# Patient Record
Sex: Male | Born: 1975 | Race: White | Hispanic: No | Marital: Married | Smoking: Current every day smoker
Health system: Southern US, Community
[De-identification: ages and names within clinical notes are randomized; demographics above are authoritative.]

## PROBLEM LIST (undated history)

## (undated) DIAGNOSIS — F988 Other specified behavioral and emotional disorders with onset usually occurring in childhood and adolescence: Secondary | ICD-10-CM

## (undated) DIAGNOSIS — Z8619 Personal history of other infectious and parasitic diseases: Secondary | ICD-10-CM

## (undated) DIAGNOSIS — F32A Depression, unspecified: Secondary | ICD-10-CM

## (undated) DIAGNOSIS — K219 Gastro-esophageal reflux disease without esophagitis: Secondary | ICD-10-CM

## (undated) DIAGNOSIS — F329 Major depressive disorder, single episode, unspecified: Secondary | ICD-10-CM

## (undated) HISTORY — PX: WISDOM TOOTH EXTRACTION: SHX21

## (undated) HISTORY — DX: Depression, unspecified: F32.A

## (undated) HISTORY — DX: Personal history of other infectious and parasitic diseases: Z86.19

## (undated) HISTORY — DX: Major depressive disorder, single episode, unspecified: F32.9

## (undated) HISTORY — PX: ESOPHAGOGASTRODUODENOSCOPY: SHX1529

## (undated) HISTORY — DX: Gastro-esophageal reflux disease without esophagitis: K21.9

## (undated) HISTORY — DX: Other specified behavioral and emotional disorders with onset usually occurring in childhood and adolescence: F98.8

---

## 2005-11-11 ENCOUNTER — Emergency Department: Payer: Self-pay | Admitting: Emergency Medicine

## 2010-02-16 ENCOUNTER — Emergency Department: Payer: Self-pay | Admitting: Emergency Medicine

## 2013-05-24 ENCOUNTER — Encounter (INDEPENDENT_AMBULATORY_CARE_PROVIDER_SITE_OTHER): Payer: Self-pay

## 2013-05-24 ENCOUNTER — Encounter: Payer: Self-pay | Admitting: Adult Health

## 2013-05-24 ENCOUNTER — Ambulatory Visit (INDEPENDENT_AMBULATORY_CARE_PROVIDER_SITE_OTHER): Payer: BC Managed Care – PPO | Admitting: Adult Health

## 2013-05-24 VITALS — BP 114/76 | HR 58 | Temp 97.8°F | Resp 14 | Ht 67.2 in | Wt 182.0 lb

## 2013-05-24 DIAGNOSIS — F17200 Nicotine dependence, unspecified, uncomplicated: Secondary | ICD-10-CM

## 2013-05-24 DIAGNOSIS — Z7189 Other specified counseling: Secondary | ICD-10-CM

## 2013-05-24 DIAGNOSIS — F988 Other specified behavioral and emotional disorders with onset usually occurring in childhood and adolescence: Secondary | ICD-10-CM

## 2013-05-24 DIAGNOSIS — Z23 Encounter for immunization: Secondary | ICD-10-CM

## 2013-05-24 DIAGNOSIS — Z716 Tobacco abuse counseling: Secondary | ICD-10-CM | POA: Insufficient documentation

## 2013-05-24 DIAGNOSIS — Z79899 Other long term (current) drug therapy: Secondary | ICD-10-CM

## 2013-05-24 DIAGNOSIS — K219 Gastro-esophageal reflux disease without esophagitis: Secondary | ICD-10-CM

## 2013-05-24 LAB — CBC WITH DIFFERENTIAL/PLATELET
BASOS PCT: 0.5 % (ref 0.0–3.0)
Basophils Absolute: 0.1 10*3/uL (ref 0.0–0.1)
EOS ABS: 0.4 10*3/uL (ref 0.0–0.7)
Eosinophils Relative: 2.7 % (ref 0.0–5.0)
HCT: 47.8 % (ref 39.0–52.0)
Hemoglobin: 16.1 g/dL (ref 13.0–17.0)
Lymphocytes Relative: 20.8 % (ref 12.0–46.0)
Lymphs Abs: 2.8 10*3/uL (ref 0.7–4.0)
MCHC: 33.7 g/dL (ref 30.0–36.0)
MCV: 88.3 fl (ref 78.0–100.0)
MONO ABS: 0.4 10*3/uL (ref 0.1–1.0)
Monocytes Relative: 2.9 % — ABNORMAL LOW (ref 3.0–12.0)
NEUTROS PCT: 73.1 % (ref 43.0–77.0)
Neutro Abs: 10 10*3/uL — ABNORMAL HIGH (ref 1.4–7.7)
Platelets: 345 10*3/uL (ref 150.0–400.0)
RBC: 5.41 Mil/uL (ref 4.22–5.81)
RDW: 13.6 % (ref 11.5–14.6)
WBC: 13.6 10*3/uL — ABNORMAL HIGH (ref 4.5–10.5)

## 2013-05-24 MED ORDER — METHYLPHENIDATE HCL 10 MG PO TABS: 10.0000 mg | ORAL_TABLET | Freq: Two times a day (BID) | ORAL | Status: DC

## 2013-05-24 NOTE — Progress Notes (Signed)
Patient ID: Ricardo Bell, male   DOB: 1975/11/07, 38 y.o.   MRN: [id number]    Subjective:    Patient ID: Ricardo Bell, male    DOB: [birth year], 38 y.o.   MRN: [id number]  HPI  [name] is a pleasant 38 y/o male who presents to establish care. He has not seen a PCP in > 10 years. Reports hx of GERD currently not on any medication. He reports that this does not occur on a regular basis. When he has symptoms he usually drinks milk and this seems to alleviate his symptoms. Smokes ~ 1/2 ppd. He would like to quit. Has tried wellbutrin in the past and quit for 3 years. He does not like the way Wellbutrin made him feel. He has been using the vaporizers to see if he decreases the amount he smokes.   Ho was diagnosed with ADD in highschool. He was started on Ritalin and felt this improved his symptoms. He then was started on Adderall. He stopped taking this medication years ago. 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 He was promoted to Naval architect and now has seen a difference in focusing on tasks and even completing tasks.   Past Medical History  Diagnosis Date  . Depression   . History of chickenpox   . GERD (gastroesophageal reflux disease)     Review of Systems  Constitutional: Negative.   HENT: Negative.   Eyes: Negative.   Respiratory: Negative.   Cardiovascular: Negative.   Gastrointestinal: Negative.        Occasional GERD  Endocrine: Negative.   Genitourinary: Negative.   Musculoskeletal: Negative.   Skin: Negative.   Allergic/Immunologic: Negative.   Neurological: Negative.   Hematological: Negative.   Psychiatric/Behavioral: Positive for decreased concentration (cannot complete tasks). Negative for suicidal ideas, behavioral problems, confusion, sleep disturbance, self-injury and agitation.       Objective:  BP 114/76  Pulse 58  Temp(Src) 97.8 F (36.6  C) (Oral)  Resp 14  Ht 5' 7.2" (1.707 m)  Wt 182 lb (82.555 kg)  BMI 28.33 kg/m2  SpO2 98%   Physical Exam  Constitutional: He is oriented to person, place, and time. He appears well-developed and well-nourished. No distress.  HENT:  Head: Normocephalic and atraumatic.  Right Ear: External ear normal.  Left Ear: External ear normal.  Nose: Nose normal.  Mouth/Throat: Oropharynx is clear and moist.  Eyes: Conjunctivae and EOM are normal. Pupils are equal, round, and reactive to light.  Neck: Normal range of motion. Neck supple. No tracheal deviation present. No thyromegaly present.  Cardiovascular: Normal rate, regular rhythm, normal heart sounds and intact distal pulses.  Exam reveals no gallop and no friction rub.   No murmur heard. Pulmonary/Chest: Effort normal and breath sounds normal. No respiratory distress. He has no wheezes. He has no rales.  Abdominal: Soft. Bowel sounds are normal. He exhibits no distension and no mass. There is no tenderness. There is no rebound and no guarding.  Musculoskeletal: Normal range of motion. He exhibits no edema and no tenderness.  Lymphadenopathy:    He has no cervical adenopathy.  Neurological: He is alert and oriented to person, place, and time. He has normal reflexes. No cranial nerve deficit. Coordination normal.  Skin: Skin is warm and dry.  Psychiatric: He has a normal mood and affect. His behavior is normal. Judgment and thought content  normal.       Assessment & Plan:   1. ADD (attention deficit disorder) Start ritalin 10 mg bid. Discussed taking med holiday on the weekend but this will be up to him. Prescriptions x 3 given. RTC for f/u in 3 months. Check baseline CBC  2. GERD (gastroesophageal reflux disease) Well controlled. No meds. Continue to follow  3. Medication management Starting ritalin. Check baseline CBC w/diff - CBC with Differential  4. Tobacco abuse counseling Discussed smoking cessation. He is very  interested. Using vapor cig to help decrease amount he smokes. Continue to encourage smoking cessation.  5. Need for Tdap vaccination Given in office today. - Tdap vaccine greater than or equal to 7yo IM

## 2013-05-24 NOTE — Patient Instructions (Signed)
  Start Ritalin 10 mg twice a day. Do not take after 6 pm.  I have given you 3 prescriptions to take to your pharmacy monthly for your refills. You will need to return to see me in 3 months for your refills.  Please have your labs drawn today before leaving the office.  I will contact you with the results once they are available.  Schedule your complete physical at your earliest convenience. I will draw full labs that day. You will need to be fasting except for water.

## 2013-05-24 NOTE — Progress Notes (Signed)
Pre visit review using our clinic review tool, if applicable. No additional management support is needed unless otherwise documented below in the visit note. 

## 2013-05-25 ENCOUNTER — Telehealth: Payer: Self-pay | Admitting: Adult Health

## 2013-05-25 NOTE — Telephone Encounter (Signed)
Relevant patient education assigned to patient using Emmi. ° °

## 2013-08-24 ENCOUNTER — Ambulatory Visit: Payer: BC Managed Care – PPO | Admitting: Adult Health

## 2015-01-10 ENCOUNTER — Encounter: Payer: Self-pay | Admitting: Emergency Medicine

## 2015-01-10 ENCOUNTER — Emergency Department
Admission: EM | Admit: 2015-01-10 | Discharge: 2015-01-10 | Disposition: A | Discharge status: Home / Self Care | Payer: Self-pay | Attending: Emergency Medicine | Admitting: Emergency Medicine

## 2015-01-10 DIAGNOSIS — Z720 Tobacco use: Secondary | ICD-10-CM | POA: Insufficient documentation

## 2015-01-10 DIAGNOSIS — A084 Viral intestinal infection, unspecified: Secondary | ICD-10-CM | POA: Insufficient documentation

## 2015-01-10 LAB — COMPREHENSIVE METABOLIC PANEL
ALT: 24 U/L (ref 17–63)
ANION GAP: 10 (ref 5–15)
AST: 24 U/L (ref 15–41)
Albumin: 4.6 g/dL (ref 3.5–5.0)
Alkaline Phosphatase: 62 U/L (ref 38–126)
BILIRUBIN TOTAL: 0.7 mg/dL (ref 0.3–1.2)
BUN: 11 mg/dL (ref 6–20)
CHLORIDE: 105 mmol/L (ref 101–111)
CO2: 24 mmol/L (ref 22–32)
Calcium: 9.5 mg/dL (ref 8.9–10.3)
Creatinine, Ser: 0.89 mg/dL (ref 0.61–1.24)
GFR calc Af Amer: >60 mL/min (ref >60)
Glucose, Bld: 140 mg/dL — ABNORMAL HIGH (ref 65–99)
POTASSIUM: 4 mmol/L (ref 3.5–5.1)
Sodium: 139 mmol/L (ref 135–145)
TOTAL PROTEIN: 7.6 g/dL (ref 6.5–8.1)

## 2015-01-10 LAB — CBC
HEMATOCRIT: 49.3 % (ref 40.0–52.0)
HEMOGLOBIN: 16.4 g/dL (ref 13.0–18.0)
MCH: 29 pg (ref 26.0–34.0)
MCHC: 33.3 g/dL (ref 32.0–36.0)
MCV: 86.8 fL (ref 80.0–100.0)
Platelets: 355 10*3/uL (ref 150–440)
RBC: 5.68 MIL/uL (ref 4.40–5.90)
RDW: 13.2 % (ref 11.5–14.5)
WBC: 13 10*3/uL — AB (ref 3.8–10.6)

## 2015-01-10 LAB — URINALYSIS COMPLETE WITH MICROSCOPIC (ARMC ONLY)
BACTERIA UA: NONE SEEN
Bilirubin Urine: NEGATIVE
GLUCOSE, UA: NEGATIVE mg/dL
HGB URINE DIPSTICK: NEGATIVE
Ketones, ur: NEGATIVE mg/dL
LEUKOCYTES UA: NEGATIVE
NITRITE: NEGATIVE
PH: 5 (ref 5.0–8.0)
Protein, ur: NEGATIVE mg/dL
SPECIFIC GRAVITY, URINE: 1.028 (ref 1.005–1.030)
Squamous Epithelial / LPF: NONE SEEN

## 2015-01-10 LAB — LIPASE, BLOOD: LIPASE: 32 U/L (ref 11–51)

## 2015-01-10 MED ORDER — ONDANSETRON HCL 4 MG PO TABS: 4.0000 mg | ORAL_TABLET | Freq: Every day | ORAL | Status: DC | PRN

## 2015-01-10 MED ORDER — SODIUM CHLORIDE 0.9 % IV SOLN
1000.0000 mL | Freq: Once | INTRAVENOUS | Status: AC
  Administered 2015-01-10: 1000 mL via INTRAVENOUS

## 2015-01-10 MED ORDER — ONDANSETRON HCL 4 MG/2ML IJ SOLN
4.0000 mg | Freq: Once | INTRAMUSCULAR | Status: AC
  Administered 2015-01-10: 4 mg via INTRAVENOUS
  Filled 2015-01-10: qty 2

## 2015-01-10 NOTE — ED Notes (Signed)
Pt has been having worsening vomiting over last week.  Unable to keep liquids down per pt.  Pain to LUQ.  Has had diarrhea.

## 2015-01-10 NOTE — ED Provider Notes (Addendum)
[facility] Emergency Department Provider Note  ____________________________________________  Time seen: 1 PM  I have reviewed the triage vital signs and the nursing notes.   HISTORY  Chief Complaint Emesis    HPI Ricardo Bell is a 39 y.o. male who presents with complaints of nausea vomiting and diarrhea for approximately 4 days. He reports he is unable tolerate by mouth's. He notes watery diarrhea initially but has not had been tolerating by mouth so has not had any stools now. Does complain of vague abdominal cramping. No fevers no chills. No recent travel. No sick contacts.     Past Medical History  Diagnosis Date  . Depression   . History of chickenpox   . GERD (gastroesophageal reflux disease)   . ADD (attention deficit disorder)     Patient Active Problem List   Diagnosis Date Noted  . Tobacco abuse counseling 05/24/2013  . Medication management 05/24/2013  . GERD (gastroesophageal reflux disease) 05/24/2013  . ADD (attention deficit disorder) 05/24/2013    History reviewed. No pertinent past surgical history.  Current Outpatient Rx  Name  Route  Sig  Dispense  Refill  . methylphenidate (RITALIN) 10 MG tablet   Oral   Take 1 tablet (10 mg total) by mouth 2 (two) times daily with breakfast and lunch.   60 tablet   0     Fill on or after 07/24/13     Allergies Review of patient's allergies indicates no known allergies.  Family History  Problem Relation Age of Onset  . Cancer Maternal Grandmother     lung cancer  . Hyperlipidemia Maternal Grandfather   . Diabetes Maternal Grandfather   . Hyperlipidemia Father   . Hypertension Father   . Obesity Paternal Grandmother   . Hearing loss Paternal Grandfather     CAD - MI    Social History Social History  Substance Use Topics  . Smoking status: Current Every Day Smoker -- 0.50 packs/day for 15 years    Types: Cigarettes  . Smokeless tobacco: None  . Alcohol Use: Yes   Comment: 6 drinks a week     Review of Systems  Constitutional: Negative for fever. Eyes: Negative for visual changes. ENT: Negative for sore throat Cardiovascular: Negative for chest pain. Respiratory: Negative for shortness of breath. Gastrointestinal: Positive for nausea, vomiting, diarrhea Genitourinary: Negative for dysuria. Musculoskeletal: Negative for back pain. Skin: Negative for rash. Neurological: Negative for headaches or focal weakness Psychiatric: No anxiety    ____________________________________________   PHYSICAL EXAM:  VITAL SIGNS: ED Triage Vitals  Enc Vitals Group     BP 01/10/15 1131 156/104 mmHg     Pulse Rate 01/10/15 1131 77     Resp 01/10/15 1131 16     Temp 01/10/15 1131 98.1 F (36.7 C)     Temp Source 01/10/15 1131 Oral     SpO2 01/10/15 1131 98 %     Weight 01/10/15 1131 180 lb (81.647 kg)     Height 01/10/15 1131 5\' 6"  (1.676 m)     Head Cir --      Peak Flow --      Pain Score 01/10/15 1132 7     Pain Loc --      Pain Edu? --      Excl. in [place]? --      Constitutional: Alert and oriented. Well appearing and in no distress. Eyes: Conjunctivae are normal.  ENT   Head: Normocephalic and atraumatic.   Mouth/Throat: Mucous  membranes are moist. Cardiovascular: Normal rate, regular rhythm. Normal and symmetric distal pulses are present in all extremities. No murmurs, rubs, or gallops. Respiratory: Normal respiratory effort without tachypnea nor retractions. Breath sounds are clear and equal bilaterally.  Gastrointestinal: Soft and non-tender in all quadrants. No distention. There is no CVA tenderness. Genitourinary: deferred Musculoskeletal: Nontender with normal range of motion in all extremities. No lower extremity tenderness nor edema. Neurologic:  Normal speech and language. No gross focal neurologic deficits are appreciated. Skin:  Skin is warm, dry and intact. No rash noted. Psychiatric: Mood and affect are normal. Patient  exhibits appropriate insight and judgment.  ____________________________________________    LABS (pertinent positives/negatives)  Labs Reviewed  COMPREHENSIVE METABOLIC PANEL - Abnormal; Notable for the following:    Glucose, Bld 140 (*)    All other components within normal limits  CBC - Abnormal; Notable for the following:    WBC 13.0 (*)    All other components within normal limits  URINALYSIS COMPLETEWITH MICROSCOPIC ([facility] ONLY) - Abnormal; Notable for the following:    Color, Urine YELLOW (*)    APPearance CLEAR (*)    All other components within normal limits  LIPASE, BLOOD    ____________________________________________   EKG  ED ECG REPORT I, Lavonia Drafts, the attending physician, personally viewed and interpreted this ECG.  Date: 01/10/2015 EKG Time: 11:30 AM Rate: 65 Rhythm: normal sinus rhythm QRS Axis: normal Intervals: normal ST/T Wave abnormalities: normal Conduction Disutrbances: none Narrative Interpretation: unremarkable   ____________________________________________    RADIOLOGY I have personally reviewed any xrays that were ordered on this patient: None  ____________________________________________   PROCEDURES  Procedure(s) performed: none  Critical Care performed: none  ____________________________________________   INITIAL IMPRESSION / ASSESSMENT AND PLAN / ED COURSE  Pertinent labs & imaging results that were available during my care of the patient were reviewed by me and considered in my medical decision making (see chart for details).  Patient presents with complaints of nausea vomiting and diarrhea. Feels dehydrated. We will give IV fluids, check labs and reevaluate  ----------------------------------------- 3:24 PM on 01/10/2015 -----------------------------------------  Patient reports feeling significantly better. Heart rate is 62, blood pressure is 134/93. He is stable for discharge we'll write him a prescription  for Zofran  ____________________________________________   FINAL CLINICAL IMPRESSION(S) / ED DIAGNOSES  Final diagnoses:  Viral gastroenteritis     Lavonia Drafts, MD 01/10/15 1524  Lavonia Drafts, MD 01/10/15 1526

## 2015-01-10 NOTE — Discharge Instructions (Signed)

## 2016-05-26 ENCOUNTER — Encounter: Payer: Self-pay | Admitting: Emergency Medicine

## 2016-05-26 ENCOUNTER — Emergency Department
Admission: EM | Admit: 2016-05-26 | Discharge: 2016-05-26 | Disposition: A | Discharge status: Home / Self Care | Payer: Commercial Managed Care - PPO | Attending: Emergency Medicine | Admitting: Emergency Medicine

## 2016-05-26 ENCOUNTER — Emergency Department: Payer: Commercial Managed Care - PPO

## 2016-05-26 DIAGNOSIS — F17210 Nicotine dependence, cigarettes, uncomplicated: Secondary | ICD-10-CM | POA: Diagnosis not present

## 2016-05-26 DIAGNOSIS — L03311 Cellulitis of abdominal wall: Secondary | ICD-10-CM | POA: Insufficient documentation

## 2016-05-26 DIAGNOSIS — L539 Erythematous condition, unspecified: Secondary | ICD-10-CM | POA: Diagnosis present

## 2016-05-26 DIAGNOSIS — F909 Attention-deficit hyperactivity disorder, unspecified type: Secondary | ICD-10-CM | POA: Insufficient documentation

## 2016-05-26 LAB — BASIC METABOLIC PANEL
Anion gap: 7 (ref 5–15)
BUN: 12 mg/dL (ref 6–20)
CO2: 26 mmol/L (ref 22–32)
Calcium: 9.3 mg/dL (ref 8.9–10.3)
Chloride: 105 mmol/L (ref 101–111)
Creatinine, Ser: 0.82 mg/dL (ref 0.61–1.24)
GFR calc Af Amer: >60 mL/min (ref >60)
GFR calc non Af Amer: >60 mL/min (ref >60)
Glucose, Bld: 87 mg/dL (ref 65–99)
Potassium: 3.9 mmol/L (ref 3.5–5.1)
Sodium: 138 mmol/L (ref 135–145)

## 2016-05-26 LAB — CBC WITH DIFFERENTIAL/PLATELET
Basophils Absolute: 0.1 10*3/uL (ref 0–0.1)
Basophils Relative: 1 %
Eosinophils Absolute: 0.5 10*3/uL (ref 0–0.7)
Eosinophils Relative: 6 %
HCT: 44.7 % (ref 40.0–52.0)
Hemoglobin: 15.4 g/dL (ref 13.0–18.0)
Lymphocytes Relative: 45 %
Lymphs Abs: 3.7 10*3/uL — ABNORMAL HIGH (ref 1.0–3.6)
MCH: 29.6 pg (ref 26.0–34.0)
MCHC: 34.5 g/dL (ref 32.0–36.0)
MCV: 85.9 fL (ref 80.0–100.0)
Monocytes Absolute: 0.5 10*3/uL (ref 0.2–1.0)
Monocytes Relative: 7 %
Neutro Abs: 3.4 10*3/uL (ref 1.4–6.5)
Neutrophils Relative %: 41 %
Platelets: 272 10*3/uL (ref 150–440)
RBC: 5.2 MIL/uL (ref 4.40–5.90)
RDW: 13.3 % (ref 11.5–14.5)
WBC: 8.3 10*3/uL (ref 3.8–10.6)

## 2016-05-26 MED ORDER — CLINDAMYCIN HCL 150 MG PO CAPS
300.0000 mg | ORAL_CAPSULE | Freq: Once | ORAL | Status: AC
  Administered 2016-05-26: 300 mg via ORAL
  Filled 2016-05-26: qty 2

## 2016-05-26 MED ORDER — CLINDAMYCIN HCL 300 MG PO CAPS: 300.0000 mg | ORAL_CAPSULE | Freq: Three times a day (TID) | ORAL | 0 refills | Status: AC

## 2016-05-26 MED ORDER — IOPAMIDOL (ISOVUE-300) INJECTION 61%
100.0000 mL | Freq: Once | INTRAVENOUS | Status: AC | PRN
  Administered 2016-05-26: 100 mL via INTRAVENOUS
  Filled 2016-05-26: qty 100

## 2016-05-26 MED ORDER — IOPAMIDOL (ISOVUE-300) INJECTION 61%
30.0000 mL | Freq: Once | INTRAVENOUS | Status: AC | PRN
  Administered 2016-05-26: 30 mL via ORAL
  Filled 2016-05-26: qty 30

## 2016-05-26 NOTE — ED Notes (Signed)
Pt discharged to home.  Family member driving.  Discharge instructions reviewed.  Verbalized understanding.  No questions or concerns at this time.  Teach back verified.  Pt in NAD.  No items left in ED.   

## 2016-05-26 NOTE — ED Provider Notes (Signed)
[facility] Emergency Department Provider Note  ____________________________________________  Time seen: Approximately 9:09 PM  I have reviewed the triage vital signs and the nursing notes.   HISTORY  Chief Complaint Cyst    HPI Ricardo Bell is a 41 y.o. male presenting to the emergency department with "hardness" over left upper quadrant. Patient states that he felt induration of the skin and noticed mild erythema of the skin overlying the left upper quadrant of his abdomen today while at lunch. Patient denies fever or chills. Patient denies a history of cutaneous abscesses or cellulitis. He denies nausea, vomiting and history of hernias. Patient denies prior GI surgeries. Patient denies bony pain, night sweats and personal history of malignancy. Patient states that affected region is tender to the touch. He rates his pain at 3/10 in intensity. No alleviating measures have been attempted.   Past Medical History:  Diagnosis Date  . ADD (attention deficit disorder)   . Depression   . GERD (gastroesophageal reflux disease)   . History of chickenpox     Patient Active Problem List   Diagnosis Date Noted  . Tobacco abuse counseling 05/24/2013  . Medication management 05/24/2013  . GERD (gastroesophageal reflux disease) 05/24/2013  . ADD (attention deficit disorder) 05/24/2013    History reviewed. No pertinent surgical history.  Prior to Admission medications   Medication Sig Start Date End Date Taking? Authorizing Provider  clindamycin (CLEOCIN) 300 MG capsule Take 1 capsule (300 mg total) by mouth 3 (three) times daily. 05/26/16 06/05/16  Lannie Fields, PA-C  ondansetron (ZOFRAN) 4 MG tablet Take 1 tablet (4 mg total) by mouth daily as needed for nausea or vomiting. 01/10/15   Lavonia Drafts, MD    Allergies Patient has no known allergies.  Family History  Problem Relation Age of Onset  . Cancer Maternal Grandmother     lung cancer  . Hyperlipidemia  Maternal Grandfather   . Diabetes Maternal Grandfather   . Hyperlipidemia Father   . Hypertension Father   . Obesity Paternal Grandmother   . Hearing loss Paternal Grandfather     CAD - MI    Social History Social History  Substance Use Topics  . Smoking status: Current Every Day Smoker    Packs/day: 0.50    Years: 15.00    Types: Cigarettes  . Smokeless tobacco: Not on file  . Alcohol use Yes     Comment: 6 - 10 drinks a week     Review of Systems  Constitutional: No fever/chills Eyes: No visual changes. No discharge ENT: No upper respiratory complaints. Cardiovascular: no chest pain. Respiratory: no cough. No SOB. Gastrointestinal:Patient has induration and erythema overlying left upper quadrant.  Genitourinary: Negative for dysuria. No hematuria Musculoskeletal: Negative for musculoskeletal pain. Neurological: Negative for headaches, focal weakness or numbness. ____________________________________________   PHYSICAL EXAM:  VITAL SIGNS: ED Triage Vitals [05/26/16 1923]  Enc Vitals Group     BP (!) 153/97     Pulse Rate 61     Resp 18     Temp 98.2 F (36.8 C)     Temp Source Oral     SpO2 99 %     Weight 175 lb (79.4 kg)     Height 5\' 6"  (1.676 m)     Head Circumference      Peak Flow      Pain Score 0     Pain Loc      Pain Edu?      Excl.  in [place]?      Constitutional: Alert and oriented. Well appearing and in no acute distress. Eyes: Conjunctivae are normal. PERRL. EOMI. Head: Atraumatic.  Hematological/Lymphatic/Immunilogical: No cervical lymphadenopathy. Cardiovascular: Normal rate, regular rhythm. Normal S1 and S2.  Good peripheral circulation. Respiratory: Normal respiratory effort without tachypnea or retractions. Lungs CTAB. Good air entry to the bases with no decreased or absent breath sounds. Gastrointestinal: Bowel sounds 4 quadrants. Patient has a 1.5 cm x 1.5 cm region of focal induration with mild erythema of the skin overlying the left  upper quadrant. Affected region is tender to palpation without fluctuance. No streaking. No distention. No CVA tenderness. Musculoskeletal: Full range of motion to all extremities. No gross deformities appreciated. Neurologic:  Normal speech and language. No gross focal neurologic deficits are appreciated.  Skin:  Skin is warm, dry and intact.  Psychiatric: Mood and affect are normal. Speech and behavior are normal. Patient exhibits appropriate insight and judgement.   ____________________________________________   LABS (all labs ordered are listed, but only abnormal results are displayed)  Labs Reviewed  CBC WITH DIFFERENTIAL/PLATELET - Abnormal; Notable for the following:       Result Value   Lymphs Abs 3.7 (*)    All other components within normal limits  BASIC METABOLIC PANEL   ____________________________________________  EKG   ____________________________________________  RADIOLOGY Unk Pinto, personally viewed and evaluated these images (plain radiographs) as part of my medical decision making, as well as reviewing the written report by the radiologist.  CT Abdomen: Negative CT of the abdomen and pelvis   Ct Abdomen W Contrast  Result Date: 05/26/2016 CLINICAL DATA:  Knot on left side, EXAM: CT ABDOMEN WITH CONTRAST TECHNIQUE: Multidetector CT imaging of the abdomen was performed using the standard protocol following bolus administration of intravenous contrast. CONTRAST:  158mL ISOVUE-300 IOPAMIDOL (ISOVUE-300) INJECTION 61% COMPARISON:  None. FINDINGS: Lower chest: Lung bases demonstrate no acute consolidation or pleural effusion. Normal heart size. Hepatobiliary: No focal liver abnormality is seen. No gallstones, gallbladder wall thickening, or biliary dilatation. Pancreas: Unremarkable. No pancreatic ductal dilatation or surrounding inflammatory changes. Spleen: Normal in size without focal abnormality. Adrenals/Urinary Tract: Adrenal glands are unremarkable.  Kidneys are normal, without renal calculi, focal lesion, or hydronephrosis. Bladder is unremarkable. Stomach/Bowel: Stomach is within normal limits. Appendix appears normal. No evidence of bowel wall thickening, distention, or inflammatory changes. Vascular/Lymphatic: No significant vascular findings are present. No enlarged abdominal or pelvic lymph nodes. Other: No abdominal wall hernia or abnormality. No abdominopelvic ascites. Musculoskeletal: No acute or significant osseous findings. IMPRESSION: Negative CT of the abdomen and pelvis. Electronically Signed   By: Donavan Foil M.D.   On: 05/26/2016 22:50    ____________________________________________    PROCEDURES  Procedure(s) performed:    Procedures    Medications  iopamidol (ISOVUE-300) 61 % injection 30 mL (30 mLs Oral Contrast Given 05/26/16 2105)  iopamidol (ISOVUE-300) 61 % injection 100 mL (100 mLs Intravenous Contrast Given 05/26/16 2213)  clindamycin (CLEOCIN) capsule 300 mg (300 mg Oral Given 05/26/16 2315)     ____________________________________________   INITIAL IMPRESSION / ASSESSMENT AND PLAN / ED COURSE  Pertinent labs & imaging results that were available during my care of the patient were reviewed by me and considered in my medical decision making (see chart for details).  Review of the [place] CSRS was performed in accordance of the [place] prior to dispensing any controlled drugs.     Assessment and Plan:  Cellulitis: Patient presents to the emergency  department with a 1.5 cm x 1.5 cm region of induration with overlying erythema of the skin overlying the left upper quadrant of the abdomen. CT abdomen with contrast revealed no abnormal findings. Physical exam findings are consistent with cellulitis. Patient was given clindamycin in the emergency department and discharged with clindamycin. Patient was given strict return precautions to return to the emergency department immediately if erythema worsens. Patient voiced  understanding regarding return precautions. Vital signs are reassuring aside from hypertension. All patient questions were answered.  ____________________________________________  FINAL CLINICAL IMPRESSION(S) / ED DIAGNOSES  Final diagnoses:  Cellulitis of abdominal wall      NEW MEDICATIONS STARTED DURING THIS VISIT:  Discharge Medication List as of 05/26/2016 11:06 PM    START taking these medications   Details  clindamycin (CLEOCIN) 300 MG capsule Take 1 capsule (300 mg total) by mouth 3 (three) times daily., Starting Tue 05/26/2016, Until Fri 06/05/2016, Print            This chart was dictated using voice recognition software/Dragon. Despite best efforts to proofread, errors can occur which can change the meaning. Any change was purely unintentional.    Lannie Fields, PA-C 05/27/16 1457    Orbie Pyo, MD 05/29/16 [phone]

## 2016-05-26 NOTE — ED Triage Notes (Signed)
Pt presents to ED with c/o a knot to his left side. Pt states he noticed it earlier today after adjusting his shirt. No hx of the same. Firm and tender to touch. Denies fever or other symptoms.

## 2016-07-23 ENCOUNTER — Encounter
Admission: RE | Admit: 2016-07-23 | Discharge: 2016-07-23 | Disposition: A | Discharge status: Home / Self Care | Payer: Commercial Managed Care - PPO | Source: Ambulatory Visit | Attending: Surgery | Admitting: Surgery

## 2016-07-23 NOTE — Patient Instructions (Signed)
  Your procedure is scheduled on: 07-30-16 Report to Same Day Surgery 2nd floor medical mall [facility] Entrance-take elevator on left to 2nd floor.  Check in with surgery information desk.) To find out your arrival time please call [phone] between 1PM - 3PM on 07-29-16  Remember: Instructions that are not followed completely may result in serious medical risk, up to and including death, or upon the discretion of your surgeon and anesthesiologist your surgery may need to be rescheduled.    _x___ 1. Do not eat food or drink liquids after midnight. No gum chewing or                              hard candies.     __x__ 2. No Alcohol for 24 hours before or after surgery.   __x__3. No Smoking for 24 prior to surgery.   ____  4. Bring all medications with you on the day of surgery if instructed.    __x__ 5. Notify your doctor if there is any change in your medical condition     (cold, fever, infections).     Do not wear jewelry, make-up, hairpins, clips or nail polish.  Do not wear lotions, powders, or perfumes. You may wear deodorant.  Do not shave 48 hours prior to surgery. Men may shave face and neck.  Do not bring valuables to the hospital.    [facility] is not responsible for any belongings or valuables.               Contacts, dentures or bridgework may not be worn into surgery.  Leave your suitcase in the car. After surgery it may be brought to your room.  For patients admitted to the hospital, discharge time is determined by your                       treatment team.   Patients discharged the day of surgery will not be allowed to drive home.  You will need someone to drive you home and stay with you the night of your procedure.    Please read over the following fact sheets that you were given:   [facility] Preparing for Surgery and or MRSA Information   ____ Take anti-hypertensive (unless it includes a diuretic), cardiac, seizure, asthma,     anti-reflux and psychiatric  medicines. These include:  1. NONE  2.  3.  4.  5.  6.  ____Fleets enema or Magnesium Citrate as directed.   ____ Use CHG Soap or sage wipes as directed on instruction sheet   ____ Use inhalers on the day of surgery and bring to hospital day of surgery  ____ Stop Metformin and Janumet 2 days prior to surgery.    ____ Take 1/2 of usual insulin dose the night before surgery and none on the morning     surgery.   ____ Follow recommendations from Cardiologist, Pulmonologist or PCP regarding          stopping Aspirin, Coumadin, Pllavix ,Eliquis, Effient, or Pradaxa, and Pletal.  X____Stop Anti-inflammatories such as Advil, Aleve, Ibuprofen, Motrin, NAPROXEN,MELOXICAM, Naprosyn, Goodies powders or aspirin products NOW-OK to take Tylenol    ____ Stop supplements until after surgery.     ____ Bring C-Pap to the hospital.

## 2016-07-30 ENCOUNTER — Ambulatory Visit
Admission: RE | Admit: 2016-07-30 | Discharge: 2016-07-30 | Disposition: A | Discharge status: Home / Self Care | Payer: Commercial Managed Care - PPO | Source: Ambulatory Visit | Attending: Surgery | Admitting: Surgery

## 2016-07-30 ENCOUNTER — Ambulatory Visit: Payer: Commercial Managed Care - PPO | Admitting: Registered Nurse

## 2016-07-30 ENCOUNTER — Encounter
Admission: RE | Disposition: A | Discharge status: Home / Self Care | Payer: Self-pay | Source: Ambulatory Visit | Attending: Surgery

## 2016-07-30 ENCOUNTER — Encounter: Payer: Self-pay | Admitting: *Deleted

## 2016-07-30 DIAGNOSIS — Z87891 Personal history of nicotine dependence: Secondary | ICD-10-CM | POA: Insufficient documentation

## 2016-07-30 DIAGNOSIS — K219 Gastro-esophageal reflux disease without esophagitis: Secondary | ICD-10-CM | POA: Diagnosis not present

## 2016-07-30 DIAGNOSIS — F909 Attention-deficit hyperactivity disorder, unspecified type: Secondary | ICD-10-CM | POA: Diagnosis not present

## 2016-07-30 DIAGNOSIS — F419 Anxiety disorder, unspecified: Secondary | ICD-10-CM | POA: Insufficient documentation

## 2016-07-30 DIAGNOSIS — D171 Benign lipomatous neoplasm of skin and subcutaneous tissue of trunk: Secondary | ICD-10-CM | POA: Diagnosis not present

## 2016-07-30 DIAGNOSIS — Z79899 Other long term (current) drug therapy: Secondary | ICD-10-CM | POA: Diagnosis not present

## 2016-07-30 HISTORY — PX: EXCISION MASS ABDOMINAL: SHX6701

## 2016-07-30 LAB — URINE DRUG SCREEN, QUALITATIVE (ARMC ONLY)
AMPHETAMINES, UR SCREEN: NOT DETECTED
Barbiturates, Ur Screen: NOT DETECTED
Benzodiazepine, Ur Scrn: NOT DETECTED
Cannabinoid 50 Ng, Ur ~~LOC~~: POSITIVE — AB
Cocaine Metabolite,Ur ~~LOC~~: NOT DETECTED
MDMA (Ecstasy)Ur Screen: NOT DETECTED
METHADONE SCREEN, URINE: NOT DETECTED
OPIATE, UR SCREEN: NOT DETECTED
Phencyclidine (PCP) Ur S: NOT DETECTED
TRICYCLIC, UR SCREEN: NOT DETECTED

## 2016-07-30 SURGERY — EXCISION, MASS, TORSO
Anesthesia: General | Laterality: Left | Wound class: Clean

## 2016-07-30 MED ORDER — FENTANYL CITRATE (PF) 100 MCG/2ML IJ SOLN
INTRAMUSCULAR | Status: DC | PRN
  Administered 2016-07-30: 100 ug via INTRAVENOUS

## 2016-07-30 MED ORDER — LIDOCAINE HCL (PF) 2 % IJ SOLN
INTRAMUSCULAR | Status: AC
  Filled 2016-07-30: qty 2

## 2016-07-30 MED ORDER — ROCURONIUM BROMIDE 50 MG/5ML IV SOLN
INTRAVENOUS | Status: AC
  Filled 2016-07-30: qty 1

## 2016-07-30 MED ORDER — ROCURONIUM BROMIDE 100 MG/10ML IV SOLN
INTRAVENOUS | Status: DC | PRN
  Administered 2016-07-30: 10 mg via INTRAVENOUS

## 2016-07-30 MED ORDER — BUPIVACAINE HCL (PF) 0.5 % IJ SOLN
INTRAMUSCULAR | Status: AC
  Filled 2016-07-30: qty 30

## 2016-07-30 MED ORDER — ACETAMINOPHEN 10 MG/ML IV SOLN
INTRAVENOUS | Status: DC | PRN
  Administered 2016-07-30: 1000 mg via INTRAVENOUS

## 2016-07-30 MED ORDER — PROPOFOL 10 MG/ML IV BOLUS
INTRAVENOUS | Status: AC
  Filled 2016-07-30: qty 20

## 2016-07-30 MED ORDER — ACETAMINOPHEN 10 MG/ML IV SOLN
INTRAVENOUS | Status: AC
  Filled 2016-07-30: qty 100

## 2016-07-30 MED ORDER — ONDANSETRON HCL 4 MG/2ML IJ SOLN
INTRAMUSCULAR | Status: AC
  Filled 2016-07-30: qty 2

## 2016-07-30 MED ORDER — MIDAZOLAM HCL 2 MG/2ML IJ SOLN
INTRAMUSCULAR | Status: AC
  Filled 2016-07-30: qty 2

## 2016-07-30 MED ORDER — LACTATED RINGERS IV SOLN
INTRAVENOUS | Status: DC
  Administered 2016-07-30: 09:00:00 via INTRAVENOUS

## 2016-07-30 MED ORDER — BUPIVACAINE-EPINEPHRINE (PF) 0.5% -1:200000 IJ SOLN
INTRAMUSCULAR | Status: DC | PRN
  Administered 2016-07-30: 10 mL

## 2016-07-30 MED ORDER — MIDAZOLAM HCL 2 MG/2ML IJ SOLN
INTRAMUSCULAR | Status: DC | PRN
  Administered 2016-07-30: 2 mg via INTRAVENOUS

## 2016-07-30 MED ORDER — HYDROCODONE-ACETAMINOPHEN 5-325 MG PO TABS: 1.0000 | ORAL_TABLET | ORAL | Status: DC | PRN

## 2016-07-30 MED ORDER — PROPOFOL 10 MG/ML IV BOLUS
INTRAVENOUS | Status: DC | PRN
  Administered 2016-07-30: 200 mg via INTRAVENOUS
  Administered 2016-07-30: 50 mg via INTRAVENOUS
  Administered 2016-07-30: 30 mg via INTRAVENOUS

## 2016-07-30 MED ORDER — ONDANSETRON HCL 4 MG/2ML IJ SOLN
INTRAMUSCULAR | Status: DC | PRN
  Administered 2016-07-30: 4 mg via INTRAVENOUS

## 2016-07-30 MED ORDER — FAMOTIDINE 20 MG PO TABS
20.0000 mg | ORAL_TABLET | Freq: Once | ORAL | Status: AC
  Administered 2016-07-30: 20 mg via ORAL

## 2016-07-30 MED ORDER — PROMETHAZINE HCL 25 MG/ML IJ SOLN: 6.2500 mg | INTRAMUSCULAR | Status: DC | PRN

## 2016-07-30 MED ORDER — FENTANYL CITRATE (PF) 100 MCG/2ML IJ SOLN: 25.0000 ug | INTRAMUSCULAR | Status: DC | PRN

## 2016-07-30 MED ORDER — GLYCOPYRROLATE 0.2 MG/ML IJ SOLN
INTRAMUSCULAR | Status: AC
  Filled 2016-07-30: qty 1

## 2016-07-30 MED ORDER — LIDOCAINE HCL (CARDIAC) 20 MG/ML IV SOLN
INTRAVENOUS | Status: DC | PRN
  Administered 2016-07-30: 100 mg via INTRAVENOUS

## 2016-07-30 MED ORDER — SUCCINYLCHOLINE CHLORIDE 20 MG/ML IJ SOLN
INTRAMUSCULAR | Status: DC | PRN
  Administered 2016-07-30: 100 mg via INTRAVENOUS

## 2016-07-30 MED ORDER — HYDROCODONE-ACETAMINOPHEN 5-325 MG PO TABS: 1.0000 | ORAL_TABLET | ORAL | 0 refills | Status: DC | PRN

## 2016-07-30 MED ORDER — BUPIVACAINE-EPINEPHRINE (PF) 0.5% -1:200000 IJ SOLN
INTRAMUSCULAR | Status: AC
  Filled 2016-07-30: qty 30

## 2016-07-30 MED ORDER — GLYCOPYRROLATE 0.2 MG/ML IJ SOLN
INTRAMUSCULAR | Status: DC | PRN
  Administered 2016-07-30: 0.2 mg via INTRAVENOUS

## 2016-07-30 MED ORDER — FENTANYL CITRATE (PF) 100 MCG/2ML IJ SOLN
INTRAMUSCULAR | Status: AC
  Filled 2016-07-30: qty 2

## 2016-07-30 MED ORDER — FAMOTIDINE 20 MG PO TABS
ORAL_TABLET | ORAL | Status: AC
  Administered 2016-07-30: 20 mg via ORAL
  Filled 2016-07-30: qty 1

## 2016-07-30 SURGICAL SUPPLY — 22 items
BLADE SURG 15 STRL LF DISP TIS (BLADE) ×1 IMPLANT
BLADE SURG 15 STRL SS (BLADE) ×2
CHLORAPREP W/TINT 26ML (MISCELLANEOUS) ×3 IMPLANT
DERMABOND ADVANCED (GAUZE/BANDAGES/DRESSINGS) ×2
DERMABOND ADVANCED .7 DNX12 (GAUZE/BANDAGES/DRESSINGS) ×1 IMPLANT
DRAPE LAPAROTOMY 100X77 ABD (DRAPES) ×3 IMPLANT
ELECT REM PT RETURN 9FT ADLT (ELECTROSURGICAL) ×3
ELECTRODE REM PT RTRN 9FT ADLT (ELECTROSURGICAL) ×1 IMPLANT
GLOVE BIO SURGEON STRL SZ7.5 (GLOVE) ×15 IMPLANT
GOWN STRL REUS W/ TWL LRG LVL3 (GOWN DISPOSABLE) ×2 IMPLANT
GOWN STRL REUS W/TWL LRG LVL3 (GOWN DISPOSABLE) ×4
KIT RM TURNOVER STRD PROC AR (KITS) ×3 IMPLANT
LABEL OR SOLS (LABEL) ×3 IMPLANT
NEEDLE HYPO 25X1 1.5 SAFETY (NEEDLE) ×3 IMPLANT
NS IRRIG 500ML POUR BTL (IV SOLUTION) ×3 IMPLANT
PACK BASIN MINOR ARMC (MISCELLANEOUS) ×3 IMPLANT
SUT MNCRL 3-0 UNDYED SH (SUTURE) ×1 IMPLANT
SUT MONOCRYL 3-0 UNDYED (SUTURE) ×2
SUT VIC AB 4-0 SH 27 (SUTURE) ×2
SUT VIC AB 4-0 SH 27XANBCTRL (SUTURE) ×1 IMPLANT
SYR BULB EAR ULCER 3OZ GRN STR (SYRINGE) ×3 IMPLANT
SYRINGE 10CC LL (SYRINGE) ×3 IMPLANT

## 2016-07-30 NOTE — Progress Notes (Signed)
Coughing a lot but pt states he has allergies and is an smoker

## 2016-07-30 NOTE — Anesthesia Postprocedure Evaluation (Signed)
Anesthesia Post Note  Patient: Ricardo Bell  Procedure(s) Performed: Procedure(s) (LRB): EXCISION MASS LEFT FLANK (Left)  Patient location during evaluation: PACU Anesthesia Type: General Level of consciousness: awake and alert Pain management: pain level controlled Vital Signs Assessment: post-procedure vital signs reviewed and stable Respiratory status: spontaneous breathing, nonlabored ventilation, respiratory function stable and patient connected to nasal cannula oxygen Cardiovascular status: blood pressure returned to baseline and stable Postop Assessment: no signs of nausea or vomiting Anesthetic complications: no     Last Vitals:  Vitals:   07/30/16 1134 07/30/16 1223  BP: (P) 137/83 (!) 141/80  Pulse: (!) (P) 48 65  Resp: (P) 16   Temp: (P) 36.1 C     Last Pain:  Vitals:   07/30/16 1134  TempSrc: (P) Temporal  PainSc:                  [name]

## 2016-07-30 NOTE — OR Nursing (Signed)
Dr. Tamala Julian here.  [facility] home

## 2016-07-30 NOTE — H&P (Signed)
  He comes in today prepared for excision of a mass of the left flank.   He reports no change in overall condition since the recent office exam.  The site was marked with ink and also marked YES  I discussed the plan for excision and also postoperative care.

## 2016-07-30 NOTE — Progress Notes (Signed)
Blood pressure 138/96   DBP ranges from 94 to 115    Dr Rosey Bath made aware  Pt not in pain    No new orders

## 2016-07-30 NOTE — Transfer of Care (Signed)
Immediate Anesthesia Transfer of Care Note  Patient: Ricardo Bell  Procedure(s) Performed: Procedure(s): EXCISION MASS LEFT FLANK (Left)  Patient Location: PACU  Anesthesia Type:General  Level of Consciousness: sedated  Airway & Oxygen Therapy: Patient Spontanous Breathing and Patient connected to face mask oxygen  Post-op Assessment: Report given to RN and Post -op Vital signs reviewed and stable  Post vital signs: Reviewed and stable  Last Vitals:  Vitals:   07/30/16 0808 07/30/16 1040  BP: 136/86 (!) 137/92  Pulse: (!) 52 78  Resp: 16 17  Temp: 36.2 C 06.7 C    Complications: No apparent anesthesia complications

## 2016-07-30 NOTE — Anesthesia Post-op Follow-up Note (Cosign Needed)
Anesthesia QCDR form completed.        

## 2016-07-30 NOTE — Op Note (Signed)
OPERATIVE REPORT  PREOPERATIVE  DIAGNOSIS: . Lipoma of left flank  POSTOPERATIVE DIAGNOSIS: . Lipoma of left flank  PROCEDURE: . Excision lipoma of left flank  ANESTHESIA:  General  SURGEON: Rochel Brome  MD   INDICATIONS: . He had recent development of a mass of the left flank. This was demonstrated on physical exam and excision was recommended for definitive treatment.  With the patient on the operating table in the supine position he was placed under general anesthesia. The patient was then rolled into the right lateral decubitus position and used an axillary roll for support and also a beanbag cushion which was activated. The mass was in the mid axillary line at the border of the costal margin. The site was prepared with ChloraPrep and draped in a sterile manner  A transversely oriented incision was made some 6 cm in length and carried down through subcutaneous tissues and through deep fascia to encounter a lipoma which was dissected free from surrounding structures. It was peeled off the underlying external oblique muscle. Several small bleeding points were cauterized. There was mild degree of lobulation and smooth margins. The ex vivo measurement was 5 x 4.5 x 1.5 cm in dimension. This was submitted in formalin for routine pathology. The wound was inspected and found that hemostasis was intact. Subcuticular tissues and underlying muscle were infiltrated with half percent Sensorcaine with epinephrine. The deep fascia was closed with interrupted 4-0 Vicryl. The skin was closed with running 4-0 Monocryl subcuticular suture and Dermabond  The patient tolerated surgery satisfactorily and was then prepared for transfer to the recovery room   [employer].D.

## 2016-07-30 NOTE — Discharge Instructions (Signed)
Take Tylenol or Norco if needed for pain.  Should not drive or do anything dangerous when taking Norco.  May shower and blot dry.  Gradually resume usual activities as tolerated.  AMBULATORY SURGERY  DISCHARGE INSTRUCTIONS   1) The drugs that you were given will stay in your system until tomorrow so for the next 24 hours you should not:  A) Drive an automobile B) Make any legal decisions C) Drink any alcoholic beverage   2) You may resume regular meals tomorrow.  Today it is better to start with liquids and gradually work up to solid foods.  You may eat anything you prefer, but it is better to start with liquids, then soup and crackers, and gradually work up to solid foods.   3) Please notify your doctor immediately if you have any unusual bleeding, trouble breathing, redness and pain at the surgery site, drainage, fever, or pain not relieved by medication.    4) Additional Instructions:  Please contact your physician with any problems or Same Day Surgery at [phone], Monday through Friday 6 am to 4 pm, or [place] at [facility] number at [phone].

## 2016-07-30 NOTE — Anesthesia Procedure Notes (Signed)
Procedure Name: Intubation Date/Time: 07/30/2016 9:36 AM Performed by: Doreen Salvage Pre-anesthesia Checklist: Patient identified, Patient being monitored, Timeout performed, Emergency Drugs available and Suction available Patient Re-evaluated:Patient Re-evaluated prior to inductionOxygen Delivery Method: Circle system utilized Preoxygenation: Pre-oxygenation with 100% oxygen Intubation Type: IV induction Ventilation: Mask ventilation without difficulty Laryngoscope Size: Mac and 3 Grade View: Grade I Tube type: Oral Tube size: 7.5 mm Number of attempts: 1 Airway Equipment and Method: Stylet Placement Confirmation: ETT inserted through vocal cords under direct vision,  positive ETCO2 and breath sounds checked- equal and bilateral Secured at: 21 cm Tube secured with: Tape Dental Injury: Teeth and Oropharynx as per pre-operative assessment

## 2016-07-30 NOTE — Anesthesia Preprocedure Evaluation (Signed)
Anesthesia Evaluation  Patient identified by MRN, date of birth, ID band Patient awake    Reviewed: Allergy & Precautions, H&P , NPO status , Patient's Chart, lab work & pertinent test results, reviewed documented beta blocker date and time   Airway Mallampati: III  TM Distance: >3 FB Neck ROM: full    Dental  (+) Missing, Teeth Intact   Pulmonary neg shortness of breath, asthma (as a child, no recent symptoms) , neg sleep apnea, neg COPD, neg recent URI, Current Smoker,           Cardiovascular Exercise Tolerance: Good negative cardio ROS       Neuro/Psych negative neurological ROS  negative psych ROS   GI/Hepatic Neg liver ROS, GERD  ,  Endo/Other  negative endocrine ROS  Renal/GU negative Renal ROS  negative genitourinary   Musculoskeletal   Abdominal   Peds  Hematology negative hematology ROS (+)   Anesthesia Other Findings Past Medical History: No date: ADD (attention deficit disorder) No date: Depression No date: GERD (gastroesophageal reflux disease)     Comment: RARE No date: History of chickenpox   Reproductive/Obstetrics negative OB ROS                             Anesthesia Physical Anesthesia Plan  ASA: II  Anesthesia Plan: General ETT   Post-op Pain Management:    Induction:   Airway Management Planned:   Additional Equipment:   Intra-op Plan:   Post-operative Plan:   Informed Consent: I have reviewed the patients History and Physical, chart, labs and discussed the procedure including the risks, benefits and alternatives for the proposed anesthesia with the patient or authorized representative who has indicated his/her understanding and acceptance.   Dental Advisory Given  Plan Discussed with: Anesthesiologist, CRNA and Surgeon  Anesthesia Plan Comments:         Anesthesia Quick Evaluation

## 2016-07-31 LAB — SURGICAL PATHOLOGY

## 2017-12-24 ENCOUNTER — Encounter: Payer: Self-pay | Admitting: Family Medicine

## 2017-12-24 ENCOUNTER — Ambulatory Visit: Payer: No Typology Code available for payment source | Admitting: Family Medicine

## 2017-12-24 VITALS — BP 118/70 | HR 99 | Temp 98.1°F | Ht 67.0 in | Wt 177.9 lb

## 2017-12-24 DIAGNOSIS — K219 Gastro-esophageal reflux disease without esophagitis: Secondary | ICD-10-CM | POA: Diagnosis not present

## 2017-12-24 DIAGNOSIS — Z1159 Encounter for screening for other viral diseases: Secondary | ICD-10-CM

## 2017-12-24 DIAGNOSIS — Z13220 Encounter for screening for lipoid disorders: Secondary | ICD-10-CM

## 2017-12-24 DIAGNOSIS — Z716 Tobacco abuse counseling: Secondary | ICD-10-CM | POA: Diagnosis not present

## 2017-12-24 DIAGNOSIS — Z23 Encounter for immunization: Secondary | ICD-10-CM | POA: Diagnosis not present

## 2017-12-24 DIAGNOSIS — E663 Overweight: Secondary | ICD-10-CM | POA: Diagnosis not present

## 2017-12-24 DIAGNOSIS — F988 Other specified behavioral and emotional disorders with onset usually occurring in childhood and adolescence: Secondary | ICD-10-CM

## 2017-12-24 DIAGNOSIS — Z114 Encounter for screening for human immunodeficiency virus [HIV]: Secondary | ICD-10-CM

## 2017-12-24 DIAGNOSIS — Z125 Encounter for screening for malignant neoplasm of prostate: Secondary | ICD-10-CM

## 2017-12-24 MED ORDER — NICOTINE 7 MG/24HR TD PT24: 7.0000 mg | MEDICATED_PATCH | Freq: Every day | TRANSDERMAL | 0 refills | Status: DC

## 2017-12-24 MED ORDER — AMPHETAMINE-DEXTROAMPHET ER 5 MG PO CP24: 5.0000 mg | ORAL_CAPSULE | Freq: Every day | ORAL | 0 refills | Status: DC

## 2017-12-24 MED ORDER — NICOTINE 14 MG/24HR TD PT24: 14.0000 mg | MEDICATED_PATCH | Freq: Every day | TRANSDERMAL | 0 refills | Status: DC

## 2017-12-24 NOTE — Progress Notes (Signed)
Name: Ricardo Bell   MRN: [id number]    DOB: 1975-04-18   Date:12/24/2017       Progress Note  Subjective  Chief Complaint  Chief Complaint  Patient presents with  . New Patient (Initial Visit)    HPI  Pt presents to establish care   Smoking Cessation Counseling: Smoking <1/2ppd (down from 1/2-1ppd). 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 He is ready to quit today and would like to try patches - we will start with 14mg  x28 days, then down to 7mg  x28 days.   GERD: Happens when he overeats; used to take prilosec but doesn't need it anymore.  No regurgitation.  No chest pain, abdominal pain, blood in stool or dark and tarry stools.   ADHD: Used to work in [employer] and it was high energy/very active job.  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 Ritalin made him too jittery and did not like the way it made him feel - was stopped taking this in 2015 (was on 10mg  immediate release).  Has history of depression, has been feeling good except for his sleep - he has been reading more and this helps to settle his mind. Checked [place] Database - no suspicious findings; reviewed old records of Ritalin Rx.  We will start with low-dose Aderall per orders.  Overweight: Likes to play disc golf with his sons and goes camping; eats out probably twice a week (fast food), usually cooks - wife makes crock pot meals, grills.   Prostate Cancer Screening:  IPSS Questionnaire (AUA-7): Over the past month.   1)  How often have you had a sensation of not emptying your bladder completely after you finish urinating?  1 - Less than 1 time in 5  2)  How often have you had to urinate again less than two hours after you finished urinating? 0 - Not at all  3)  How often have you found you stopped and started again several times when you  urinated?  0 - Not at all  4) How difficult have you found it to postpone urination?  0 - Not at all  5) How often have you had a weak urinary stream?  0 - Not at all  6) How often have you had to push or strain to begin urination?  0 - Not at all  7) How many times did you most typically get up to urinate from the time you went to bed until the time you got up in the morning?  0 - None  Total score:  0-7 mildly symptomatic   8-19 moderately symptomatic   20-35 severely symptomatic  Score 1 - Pt requests to check PSA today as he is unsure of family history.   Patient Active Problem List   Diagnosis Date Noted  . Tobacco abuse counseling 05/24/2013  . Medication management 05/24/2013  . GERD (gastroesophageal reflux disease) 05/24/2013  . ADD (attention deficit disorder) 05/24/2013    Past Surgical History:  Procedure Laterality Date  . ESOPHAGOGASTRODUODENOSCOPY    . EXCISION MASS ABDOMINAL Left 07/30/2016   Procedure: EXCISION MASS LEFT FLANK;  Surgeon: Leonie Green, MD;  Location: [facility] ORS;  Service: General;  Laterality: Left;  . WISDOM TOOTH EXTRACTION     Family History  Problem Relation Age of Onset  .  Cancer Maternal Grandmother        lung cancer  . Hyperlipidemia Maternal Grandfather   . Diabetes Maternal Grandfather   . Hyperlipidemia Father   . Hypertension Father   . Obesity Paternal Grandmother   . Hearing loss Paternal Grandfather        CAD - MI  . Heart disease Paternal Grandfather     Social History   Socioeconomic History  . Marital status: Married    Spouse name: [name]  . Number of children: 2  . Years of education: 54  . Highest education level: Some college, no degree  Occupational History  . Occupation: Research scientist (life sciences): [employer]    Comment: [employer]  . Financial resource strain: Not hard at all  . Food insecurity:    Worry: Never true    Inability: Never true  . Transportation  needs:    Medical: No    Non-medical: No  Tobacco Use  . Smoking status: Current Every Day Smoker    Packs/day: 0.50    Years: 15.00    Pack years: 7.50    Types: Cigarettes  . Smokeless tobacco: Never Used  Substance and Sexual Activity  . Alcohol use: Yes    Comment: 2-3 drinks a week   . Drug use: Yes    Types: Marijuana    Comment: occ  . Sexual activity: Yes    Partners: Female  Lifestyle  . Physical activity:    Days per week: 2 days    Minutes per session: 120 min  . Stress: Only a little  Relationships  . Social connections:    Talks on phone: Three times a week    Gets together: Three times a week    Attends religious service: More than 4 times per year    Active member of club or organization: Yes    Attends meetings of clubs or organizations: More than 4 times per year    Relationship status: Married  . Intimate partner violence:    Fear of current or ex partner: No    Emotionally abused: No    Physically abused: No    Forced sexual activity: No  Other Topics Concern  . Not on file  Social History Narrative   Ricardo Bell grew up in [place]. He lives with his wife and 2 sons in [place]. 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 He plays disc golf on his spare time.      Caffeine - Redbull in the morning   Exercise - walking 3 times a week during disc golf     Current Outpatient Medications:  .  ibuprofen (ADVIL,MOTRIN) 200 MG tablet, Take 200 mg by mouth as needed., Disp: , Rfl:  .  acetaminophen (TYLENOL) 325 MG tablet, Take 650 mg by mouth every 6 (six) hours as needed (for pain/headache.)., Disp: , Rfl:  .  amphetamine-dextroamphetamine (ADDERALL XR) 5 MG 24 hr capsule, Take 1 capsule (5 mg total) by mouth daily., Disp: 30 capsule, Rfl: 0 .  fluticasone (FLONASE) 50 MCG/ACT nasal spray, Place 2 sprays into both nostrils daily as needed. For stuffy nose, Disp: , Rfl: 1 .  HYDROcodone-acetaminophen (NORCO) 5-325 MG tablet, Take 1-2 tablets by mouth every 4 (four) hours  as needed for moderate pain. (Patient not taking: Reported on 12/24/2017), Disp: 8 tablet, Rfl: 0 .  meloxicam (MOBIC) 15 MG tablet, Take 15 mg by mouth daily., Disp: , Rfl: 0 .  naproxen sodium (ANAPROX)  220 MG tablet, Take 440 mg by mouth 2 (two) times daily as needed (for pain.)., Disp: , Rfl:  .  nicotine (NICODERM CQ - DOSED IN MG/24 HOURS) 14 mg/24hr patch, Place 1 patch (14 mg total) onto the skin daily., Disp: 28 patch, Rfl: 0 .  nicotine (NICODERM CQ - DOSED IN MG/24 HR) 7 mg/24hr patch, Place 1 patch (7 mg total) onto the skin daily., Disp: 28 patch, Rfl: 0  No Known Allergies  I personally reviewed active problem list, medication list, allergies, family history, social history, health maintenance, lab results with the patient/caregiver today.   ROS  Constitutional: Negative for fever or weight change.  Respiratory: Negative for cough and shortness of breath.   Cardiovascular: Negative for chest pain or palpitations.  Gastrointestinal: Negative for abdominal pain, no bowel changes.  Musculoskeletal: Negative for gait problem or joint swelling.  Skin: Negative for rash.  Neurological: Negative for dizziness or headache.  No other specific complaints in a complete review of systems (except as listed in HPI above).  Objective  Vitals:   12/24/17 1311  BP: 118/70  Pulse: 99  Temp: 98.1 F (36.7 C)  SpO2: 99%  Weight: 177 lb 14.4 oz (80.7 kg)   Body mass index is 27.86 kg/m.  Physical Exam  Constitutional: Patient appears well-developed and well-nourished. No distress.  HENT: Head: Normocephalic and atraumatic. Eyes: Conjunctivae and EOM are normal. No scleral icterus.  Pupils are equal, round, and reactive to light.  Neck: Normal range of motion. Neck supple. No JVD present. No thyromegaly present.  Cardiovascular: Normal rate, regular rhythm and normal heart sounds.  No murmur heard. No BLE edema. Pulmonary/Chest: Effort normal and breath sounds normal. No respiratory  distress. Musculoskeletal: Normal range of motion, no joint effusions. No gross deformities Neurological: Pt is alert and oriented to person, place, and time. No cranial nerve deficit. Coordination, balance, strength, speech and gait are normal.  Skin: Skin is warm and dry. No rash noted. No erythema.  Psychiatric: Patient has a normal mood and affect. behavior is normal. Judgment and thought content normal.  No results found for this or any previous visit (from the past 72 hour(s)).  PHQ2/9: Depression screen [facility] 2/9 12/24/2017 12/24/2017  Decreased Interest 0 0  Down, Depressed, Hopeless 0 0  PHQ - 2 Score 0 0  Altered sleeping 3 -  Tired, decreased energy 0 -  Change in appetite 0 -  Feeling bad or failure about yourself  0 -  Trouble concentrating 0 -  Moving slowly or fidgety/restless 0 -  Suicidal thoughts 0 -  PHQ-9 Score 3 -  Difficult doing work/chores Not difficult at all -   Functional Status Survey: Is the patient deaf or have difficulty hearing?: No Does the patient have difficulty seeing, even when wearing glasses/contacts?: Yes Does the patient have difficulty concentrating, remembering, or making decisions?: No Does the patient have difficulty walking or climbing stairs?: No Does the patient have difficulty dressing or bathing?: No Does the patient have difficulty doing errands alone such as visiting a doctor's office or shopping?: No  Assessment & Plan  1. Tobacco abuse counseling - Cessation discussed in detail; advised to choose quit date, then start patches. - nicotine (NICODERM CQ - DOSED IN MG/24 HOURS) 14 mg/24hr patch; Place 1 patch (14 mg total) onto the skin daily.  Dispense: 28 patch; Refill: 0 - nicotine (NICODERM CQ - DOSED IN MG/24 HR) 7 mg/24hr patch; Place 1 patch (7 mg total) onto the skin  daily.  Dispense: 28 patch; Refill: 0  2. Gastroesophageal reflux disease without esophagitis - Avoid triggers; stable  3. Attention deficit disorder,  unspecified hyperactivity presence - 5mg  Aderall XR per orders - follow up in 1 month to check on dosing.  4. Overweight (BMI 25.0-29.9) - COMPLETE METABOLIC PANEL WITH GFR - Discussed importance of 150 minutes of physical activity weekly, eat two servings of fish weekly, eat one serving of tree nuts ( cashews, pistachios, pecans, almonds.Marland Kitchen) every other day, eat 6 servings of fruit/vegetables daily and drink plenty of water and avoid sweet beverages.   5. Need for influenza vaccination - Flu Vaccine QUAD 6+ mos PF IM (Fluarix Quad PF)  6. Lipid screening - Lipid panel  7. Prostate cancer screening - PSA  8. Encounter for screening for HIV - HIV Antibody (routine testing w rflx)  9. Need for hepatitis C screening test - Hepatitis C antibody

## 2017-12-25 LAB — COMPLETE METABOLIC PANEL WITH GFR
AG RATIO: 2.1 (calc) (ref 1.0–2.5)
ALBUMIN MSPROF: 4.6 g/dL (ref 3.6–5.1)
ALT: 32 U/L (ref 9–46)
AST: 25 U/L (ref 10–40)
Alkaline phosphatase (APISO): 63 U/L (ref 40–115)
BUN: 12 mg/dL (ref 7–25)
CO2: 25 mmol/L (ref 20–32)
Calcium: 9.3 mg/dL (ref 8.6–10.3)
Chloride: 103 mmol/L (ref 98–110)
Creat: 0.86 mg/dL (ref 0.60–1.35)
GFR, EST AFRICAN AMERICAN: 126 mL/min/{1.73_m2} (ref >=60)
GFR, Est Non African American: 108 mL/min/{1.73_m2} (ref >=60)
GLUCOSE: 70 mg/dL (ref 65–99)
Globulin: 2.2 g/dL (calc) (ref 1.9–3.7)
Potassium: 4.2 mmol/L (ref 3.5–5.3)
Sodium: 138 mmol/L (ref 135–146)
TOTAL PROTEIN: 6.8 g/dL (ref 6.1–8.1)
Total Bilirubin: 0.6 mg/dL (ref 0.2–1.2)

## 2017-12-25 LAB — LIPID PANEL
Cholesterol: 244 mg/dL — ABNORMAL HIGH (ref <200)
HDL: 37 mg/dL — ABNORMAL LOW (ref >40)
LDL CHOLESTEROL (CALC): 166 mg/dL — AB
Non-HDL Cholesterol (Calc): 207 mg/dL (calc) — ABNORMAL HIGH (ref <130)
TRIGLYCERIDES: 244 mg/dL — AB (ref <150)
Total CHOL/HDL Ratio: 6.6 (calc) — ABNORMAL HIGH (ref <5.0)

## 2017-12-25 LAB — PSA: PSA: 0.3 ng/mL (ref <=4.0)

## 2017-12-25 LAB — HEPATITIS C ANTIBODY
HEP C AB: NONREACTIVE
SIGNAL TO CUT-OFF: 0.02 (ref <1.00)

## 2017-12-25 LAB — HIV ANTIBODY (ROUTINE TESTING W REFLEX): HIV: NONREACTIVE

## 2018-01-04 ENCOUNTER — Encounter: Payer: Self-pay | Admitting: Family Medicine

## 2018-01-24 ENCOUNTER — Ambulatory Visit: Payer: No Typology Code available for payment source | Admitting: Family Medicine

## 2018-02-15 ENCOUNTER — Encounter: Payer: Self-pay | Admitting: Family Medicine

## 2018-02-15 ENCOUNTER — Ambulatory Visit: Payer: No Typology Code available for payment source | Admitting: Family Medicine

## 2018-02-15 VITALS — BP 112/70 | HR 74 | Temp 98.1°F | Resp 16 | Ht 67.0 in | Wt 178.6 lb

## 2018-02-15 DIAGNOSIS — Z808 Family history of malignant neoplasm of other organs or systems: Secondary | ICD-10-CM

## 2018-02-15 DIAGNOSIS — Z0000 Encounter for general adult medical examination without abnormal findings: Secondary | ICD-10-CM | POA: Diagnosis not present

## 2018-02-15 DIAGNOSIS — D229 Melanocytic nevi, unspecified: Secondary | ICD-10-CM

## 2018-02-15 NOTE — Progress Notes (Signed)
Name: Ricardo Bell   MRN: [id number]    DOB: Oct 27, 1975   Date:02/15/2018       Progress Note  Subjective  Chief Complaint  Chief Complaint  Patient presents with  . Annual Exam    HPI  Patient presents for annual CPE.  USPSTF grade A and B recommendations:  Diet: Has changed since cholesterol readings - cut out caffeine, stopped drinking sodas all day, stopped putting salt on food, has not been eating as much ice cream, eating less red meat, feeling better with more energy and is sleeping better. Exercise: He is not exercising as much due to the weather; he is getting a [employer] for the entire family.  Depression:  Depression screen [facility] 2/9 02/15/2018 12/24/2017 12/24/2017  Decreased Interest 0 0 0  Down, Depressed, Hopeless 0 0 0  PHQ - 2 Score 0 0 0  Altered sleeping 0 3 -  Tired, decreased energy 0 0 -  Change in appetite 0 0 -  Feeling bad or failure about yourself  0 0 -  Trouble concentrating 0 0 -  Moving slowly or fidgety/restless 0 0 -  Suicidal thoughts 0 0 -  PHQ-9 Score 0 3 -  Difficult doing work/chores Not difficult at all Not difficult at all -   Hypertension:  BP Readings from Last 3 Encounters:  02/15/18 112/70  12/24/17 118/70  07/30/16 (!) 141/80   Obesity: Wt Readings from Last 3 Encounters:  02/15/18 178 lb 9.6 oz (81 kg)  12/24/17 177 lb 14.4 oz (80.7 kg)  07/23/16 178 lb (80.7 kg)   BMI Readings from Last 3 Encounters:  02/15/18 27.97 kg/m  12/24/17 27.86 kg/m  07/23/16 27.88 kg/m    Lipids: Discussed in detail; no statin at this time - ASCVD risk score of 6.7%. Lab Results  Component Value Date   CHOL 244 (H) 12/24/2017   Lab Results  Component Value Date   HDL 37 (L) 12/24/2017   Lab Results  Component Value Date   LDLCALC 166 (H) 12/24/2017   Lab Results  Component Value Date   TRIG 244 (H) 12/24/2017   Lab Results  Component Value Date   CHOLHDL 6.6 (H) 12/24/2017   No results found for:  LDLDIRECT Glucose:  Glucose, Bld  Date Value Ref Range Status  12/24/2017 70 65 - 99 mg/dL Final    Comment:    .            Fasting reference interval .   05/26/2016 87 65 - 99 mg/dL Final  01/10/2015 140 (H) 65 - 99 mg/dL Final      Office Visit from 02/15/2018 in [facility]  AUDIT-C Score  3    Has cut back quite a bit - He is drinking about 4 beers a week.  Married STD testing and prevention (HIV/chl/gon/syphilis): Hep C and HIV negative 12/24/2017; declines others  Skin cancer: Has several moles on his back that he is concerned with and has family history of melanoma. We will send for skin survey.   Colorectal cancer: Denies family or personal history of colorectal cancer, no changes in BM's - no blood in stool, dark and tarry stool, mucus in stool, or constipation/diarrhea. Prostate cancer: No family history Lab Results  Component Value Date   PSA 0.3 12/24/2017   IPSS Questionnaire (AUA-7): Over the past month.   1)  How often have you had a sensation of not emptying your bladder completely after you finish urinating?  0 - Not at all  2)  How often have you had to urinate again less than two hours after you finished urinating? 1 - Less than 1 time in 5  3)  How often have you found you stopped and started again several times when you urinated?  1 - Less than 1 time in 5  4) How difficult have you found it to postpone urination?  0 - Not at all  5) How often have you had a weak urinary stream?  0 - Not at all  6) How often have you had to push or strain to begin urination?  0 - Not at all  7) How many times did you most typically get up to urinate from the time you went to bed until the time you got up in the morning?  0 - None  Total score:  0-7 mildly symptomatic   8-19 moderately symptomatic   20-35 severely symptomatic  Score of 2  Lung cancer: Low Dose CT Chest recommended if Age 61-80 years, 30 pack-year currently smoking OR have quit w/in  15years. Patient does not qualify.  Maternal Grandmother had lung cancer. Is trying to cut back. AAA: Not indicated - The USPSTF recommends one-time screening with ultrasonography in men ages 46 to 58 years who have ever smoked ECG: We have on file; not indicated to repeat today; no chest pain, shortness of breath, or palpitations.  Advanced Care Planning: A voluntary discussion about advance care planning including the explanation and discussion of advance directives.  Discussed health care proxy and Living will, and the patient was able to identify a health care proxy as .  Patient does not have a living will at present time. If patient does have living will, I have requested they bring this to the clinic to be scanned in to their chart.  Patient Active Problem List   Diagnosis Date Noted  . Tobacco abuse counseling 05/24/2013  . Medication management 05/24/2013  . GERD (gastroesophageal reflux disease) 05/24/2013  . ADD (attention deficit disorder) 05/24/2013    Past Surgical History:  Procedure Laterality Date  . ESOPHAGOGASTRODUODENOSCOPY    . EXCISION MASS ABDOMINAL Left 07/30/2016   Procedure: EXCISION MASS LEFT FLANK;  Surgeon: Leonie Green, MD;  Location: [facility] ORS;  Service: General;  Laterality: Left;  . WISDOM TOOTH EXTRACTION      Family History  Problem Relation Age of Onset  . Cancer Maternal Grandmother        lung cancer  . Hyperlipidemia Maternal Grandfather   . Diabetes Maternal Grandfather   . Hyperlipidemia Father   . Hypertension Father   . Obesity Paternal Grandmother   . Hearing loss Paternal Grandfather        CAD - MI  . Heart disease Paternal Grandfather     Social History   Socioeconomic History  . Marital status: Married    Spouse name: [name]  . Number of children: 2  . Years of education: 72  . Highest education level: Some college, no degree  Occupational History  . Occupation: Research scientist (life sciences): [employer]     Comment: [place]  . Financial resource strain: Not hard at all  . Food insecurity:    Worry: Never true    Inability: Never true  . Transportation needs:    Medical: No    Non-medical: No  Tobacco Use  . Smoking status: Current Every Day Smoker    Packs/day: 0.50  Years: 15.00    Pack years: 7.50    Types: Cigarettes  . Smokeless tobacco: Never Used  Substance and Sexual Activity  . Alcohol use: Yes    Comment: 2-3 drinks a week   . Drug use: Yes    Types: Marijuana    Comment: occ  . Sexual activity: Yes    Partners: Female  Lifestyle  . Physical activity:    Days per week: 2 days    Minutes per session: 30 min  . Stress: Only a little  Relationships  . Social connections:    Talks on phone: Three times a week    Gets together: Three times a week    Attends religious service: More than 4 times per year    Active member of club or organization: Yes    Attends meetings of clubs or organizations: More than 4 times per year    Relationship status: Married  . Intimate partner violence:    Fear of current or ex partner: No    Emotionally abused: No    Physically abused: No    Forced sexual activity: No  Other Topics Concern  . Not on file  Social History Narrative   [name] grew up in [place]. He lives with his wife and 2 sons in [place]. 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      Caffeine - Redbull in the morning   Exercise - walking 3 times a week during disc golf     Current Outpatient Medications:  .  acetaminophen (TYLENOL) 325 MG tablet, Take 650 mg by mouth every 6 (six) hours as needed (for pain/headache.)., Disp: , Rfl:  .  meloxicam (MOBIC) 15 MG tablet, Take 15 mg by mouth daily., Disp: , Rfl: 0 .  naproxen sodium (ANAPROX) 220 MG tablet, Take 440 mg by mouth 2 (two) times daily as needed (for pain.)., Disp: , Rfl:  .  amphetamine-dextroamphetamine (ADDERALL XR) 5 MG 24 hr capsule, Take 1 capsule (5  mg total) by mouth daily. (Patient not taking: Reported on 02/15/2018), Disp: 30 capsule, Rfl: 0 .  fluticasone (FLONASE) 50 MCG/ACT nasal spray, Place 2 sprays into both nostrils daily as needed. For stuffy nose, Disp: , Rfl: 1 .  HYDROcodone-acetaminophen (NORCO) 5-325 MG tablet, Take 1-2 tablets by mouth every 4 (four) hours as needed for moderate pain. (Patient not taking: Reported on 12/24/2017), Disp: 8 tablet, Rfl: 0 .  ibuprofen (ADVIL,MOTRIN) 200 MG tablet, Take 200 mg by mouth as needed., Disp: , Rfl:  .  nicotine (NICODERM CQ - DOSED IN MG/24 HOURS) 14 mg/24hr patch, Place 1 patch (14 mg total) onto the skin daily. (Patient not taking: Reported on 02/15/2018), Disp: 28 patch, Rfl: 0 .  nicotine (NICODERM CQ - DOSED IN MG/24 HR) 7 mg/24hr patch, Place 1 patch (7 mg total) onto the skin daily. (Patient not taking: Reported on 02/15/2018), Disp: 28 patch, Rfl: 0  No Known Allergies   ROS  Constitutional: Negative for fever or weight change.  Respiratory: Negative for cough and shortness of breath.   Cardiovascular: Negative for chest pain or palpitations.  Gastrointestinal: Negative for abdominal pain, no bowel changes.  Musculoskeletal: Negative for gait problem or joint swelling.  Skin: Negative for rash.  Neurological: Negative for dizziness or headache.  No other specific complaints in a complete review of systems (except as listed in HPI above).   Objective  Vitals:   02/15/18 0733  BP: 112/70  Pulse: 74  Resp: 16  Temp: 98.1 F (36.7 C)  TempSrc: Oral  SpO2: 99%  Weight: 178 lb 9.6 oz (81 kg)  Height: 5\' 7"  (1.702 m)    Body mass index is 27.97 kg/m.  Physical Exam  Constitutional: Patient appears well-developed and well-nourished. No distress.  HENT: Head: Normocephalic and atraumatic. Ears: B TMs ok, no erythema or effusion; Nose: Nose normal. Mouth/Throat: Oropharynx is clear and moist. No oropharyngeal exudate.  Eyes: Conjunctivae and EOM are normal.  Pupils are equal, round, and reactive to light. No scleral icterus.  Neck: Normal range of motion. Neck supple. No JVD present. No thyromegaly present.  Cardiovascular: Normal rate, regular rhythm and normal heart sounds.  No murmur heard. No BLE edema. Pulmonary/Chest: Effort normal and breath sounds normal. No respiratory distress. Abdominal: Soft. Bowel sounds are normal, no distension. There is no tenderness. no masses MALE GENITALIA: Deferred RECTAL: Deferred Musculoskeletal: Normal range of motion, no joint effusions. No gross deformities Neurological: he is alert and oriented to person, place, and time. No cranial nerve deficit. Coordination, balance, strength, speech and gait are normal.  Skin: Skin is warm and dry. No rash noted. No erythema.  Psychiatric: Patient has a normal mood and affect. behavior is normal. Judgment and thought content normal.  Recent Results (from the past 2160 hour(s))  COMPLETE METABOLIC PANEL WITH GFR     Status: None   Collection Time: 12/24/17  2:15 PM  Result Value Ref Range   Glucose, Bld 70 65 - 99 mg/dL    Comment: .            Fasting reference interval .    BUN 12 7 - 25 mg/dL   Creat 0.86 0.60 - 1.35 mg/dL   GFR, Est Non African American 108 > OR = 60 mL/min/1.65m2   GFR, Est African American 126 > OR = 60 mL/min/1.41m2   BUN/Creatinine Ratio NOT APPLICABLE 6 - 22 (calc)   Sodium 138 135 - 146 mmol/L   Potassium 4.2 3.5 - 5.3 mmol/L   Chloride 103 98 - 110 mmol/L   CO2 25 20 - 32 mmol/L   Calcium 9.3 8.6 - 10.3 mg/dL   Total Protein 6.8 6.1 - 8.1 g/dL   Albumin 4.6 3.6 - 5.1 g/dL   Globulin 2.2 1.9 - 3.7 g/dL (calc)   AG Ratio 2.1 1.0 - 2.5 (calc)   Total Bilirubin 0.6 0.2 - 1.2 mg/dL   Alkaline phosphatase (APISO) 63 40 - 115 U/L   AST 25 10 - 40 U/L   ALT 32 9 - 46 U/L  Lipid panel     Status: Abnormal   Collection Time: 12/24/17  2:15 PM  Result Value Ref Range   Cholesterol 244 (H) <200 mg/dL   HDL 37 (L) >40 mg/dL    Triglycerides 244 (H) <150 mg/dL    Comment: . If a non-fasting specimen was collected, consider repeat triglyceride testing on a fasting specimen if clinically indicated.  Yates Decamp et al. J. of Clin. Lipidol. 5885;0:277-412. Marland Kitchen    LDL Cholesterol (Calc) 166 (H) mg/dL (calc)    Comment: Reference range: <100 . Desirable range <100 mg/dL for primary prevention;   <70 mg/dL for patients with CHD or diabetic patients  with > or = 2 CHD risk factors. Marland Kitchen LDL-C is now calculated using the Martin-Hopkins  calculation, which is a validated novel method providing  better accuracy than the Friedewald equation in the  estimation of LDL-C.  Cresenciano Genre et al.  JAMA. 1914;782(95): [phone]  (http://education.[facility].com/faq/FAQ164)    Total CHOL/HDL Ratio 6.6 (H) <5.0 (calc)   Non-HDL Cholesterol (Calc) 207 (H) <130 mg/dL (calc)    Comment: For patients with diabetes plus 1 major ASCVD risk  factor, treating to a non-HDL-C goal of <100 mg/dL  (LDL-C of <70 mg/dL) is considered a therapeutic  option.   PSA     Status: None   Collection Time: 12/24/17  2:15 PM  Result Value Ref Range   PSA 0.3 < OR = 4.0 ng/mL    Comment: The total PSA value from this assay system is  standardized against the WHO standard. The test  result will be approximately 20% lower when compared  to the equimolar-standardized total PSA (Beckman  Coulter). Comparison of serial PSA results should be  interpreted with this fact in mind. . This test was performed using the Siemens  chemiluminescent method. Values obtained from  different assay methods cannot be used interchangeably. PSA levels, regardless of value, should not be interpreted as absolute evidence of the presence or absence of disease.   HIV Antibody (routine testing w rflx)     Status: None   Collection Time: 12/24/17  2:15 PM  Result Value Ref Range   HIV 1&2 Ab, 4th Generation NON-REACTIVE NON-REACTI    Comment: HIV-1 antigen and  HIV-1/HIV-2 antibodies were not detected. There is no laboratory evidence of HIV infection. Marland Kitchen PLEASE NOTE: This information has been disclosed to you from records whose confidentiality may be protected by state law.  If your state requires such protection, then the state law prohibits you from making any further disclosure of the information without the specific written consent of the person to whom it pertains, or as otherwise permitted by law. A general authorization for the release of medical or other information is NOT sufficient for this purpose. . For additional information please refer to http://education.[facility].com/faq/FAQ106 (This link is being provided for informational/ educational purposes only.) . Marland Kitchen The performance of this assay has not been clinically validated in patients less than 75 years old. .   Hepatitis C antibody     Status: None   Collection Time: 12/24/17  2:15 PM  Result Value Ref Range   Hepatitis C Ab NON-REACTIVE NON-REACTI   SIGNAL TO CUT-OFF 0.02 <1.00    Comment: . HCV antibody was non-reactive. There is no laboratory  evidence of HCV infection. . In most cases, no further action is required. However, if recent HCV exposure is suspected, a test for HCV RNA (test code [phone]) is suggested. . For additional information please refer to http://education.[facility].com/faq/FAQ22v1 (This link is being provided for informational/ educational purposes only.) .      PHQ2/9: Depression screen [facility] 2/9 02/15/2018 12/24/2017 12/24/2017  Decreased Interest 0 0 0  Down, Depressed, Hopeless 0 0 0  PHQ - 2 Score 0 0 0  Altered sleeping 0 3 -  Tired, decreased energy 0 0 -  Change in appetite 0 0 -  Feeling bad or failure about yourself  0 0 -  Trouble concentrating 0 0 -  Moving slowly or fidgety/restless 0 0 -  Suicidal thoughts 0 0 -  PHQ-9 Score 0 3 -  Difficult doing work/chores Not difficult at all Not difficult at all -    Fall Risk: Fall Risk  02/15/2018  Falls in the past year? 0  Number falls in past yr: 0  Injury with Fall? 0    Assessment & Plan  1. Annual physical exam -Prostate  cancer screening and PSA options (with potential risks and benefits of testing vs not testing) were discussed along with recent recs/guidelines. -USPSTF grade A and B recommendations reviewed with patient; age-appropriate recommendations, preventive care, screening tests, etc discussed and encouraged; healthy living encouraged; see AVS for patient education given to patient -Discussed importance of 150 minutes of physical activity weekly, eat two servings of fish weekly, eat one serving of tree nuts ( cashews, pistachios, pecans, almonds.Marland Kitchen) every other day, eat 6 servings of fruit/vegetables daily and drink plenty of water and avoid sweet beverages.   2. Multiple nevi - Ambulatory referral to Dermatology  3. Family history of melanoma - Ambulatory referral to Dermatology

## 2018-02-18 ENCOUNTER — Encounter: Payer: Self-pay | Admitting: Family Medicine

## 2018-03-06 IMAGING — CT CT ABDOMEN W/ CM
2 of 4 series · 16 of 46 positions shown, 18 images · IV contrast (APPLIED)
Comparison: None.

CLINICAL DATA: Knot on left side,

EXAM:
CT ABDOMEN WITH CONTRAST
TECHNIQUE: Multidetector CT imaging of the abdomen was performed using the
standard protocol following bolus administration of intravenous
contrast.
CONTRAST:  100mL 90UI3Y-GRR IOPAMIDOL (90UI3Y-GRR) INJECTION 61%

[Series 2: routine abd/pel with · axial · 0.90mm/px · z∈[-1085,-665]mm · 13 of 94 slices shown, 15 images]
[im 5/94  soft-tissue]
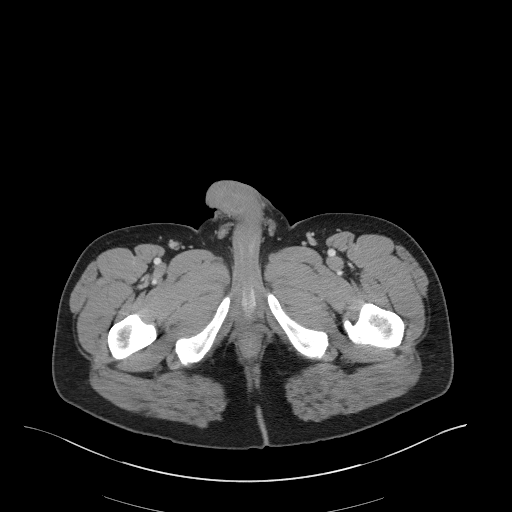
[im 5/94  bone]
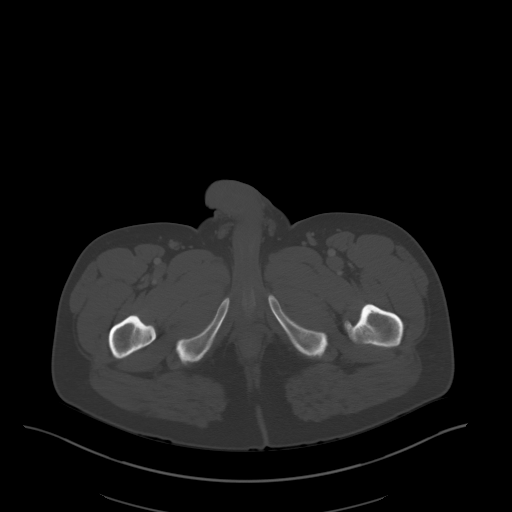
[im 13/94  soft-tissue]
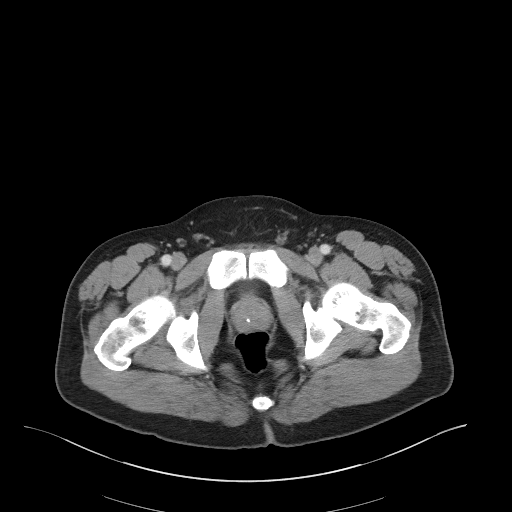
[im 21/94  soft-tissue]
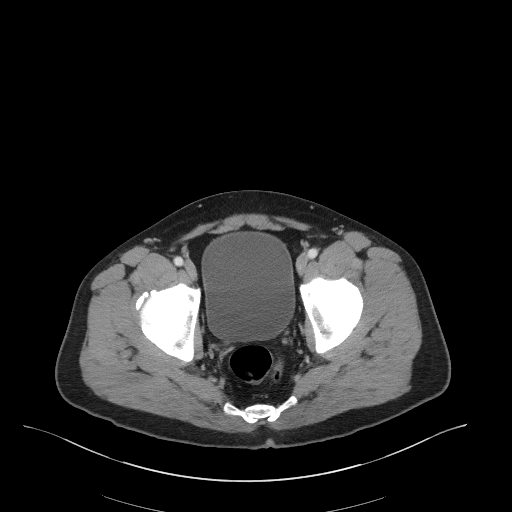
[im 25/94  soft-tissue]
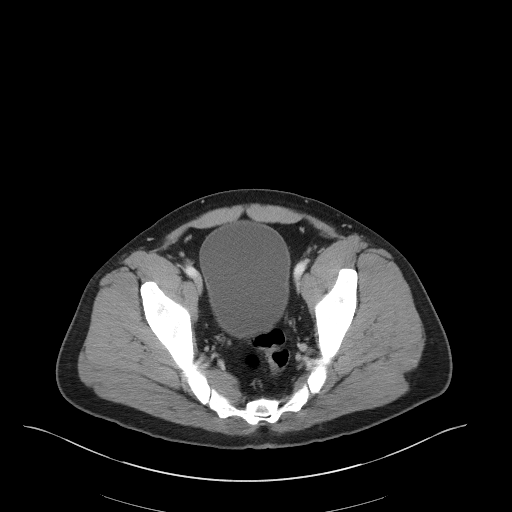
[im 33/94  soft-tissue]
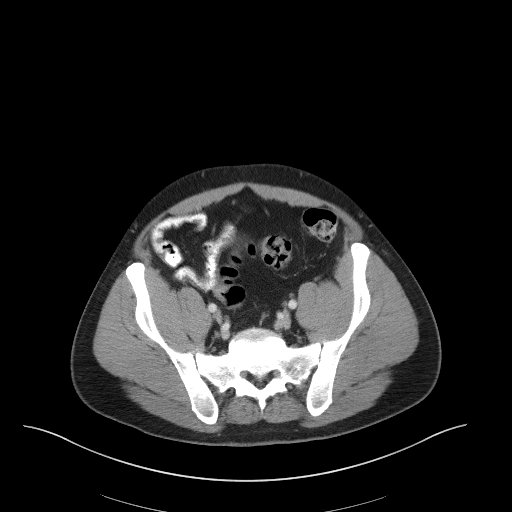
[im 41/94  soft-tissue]
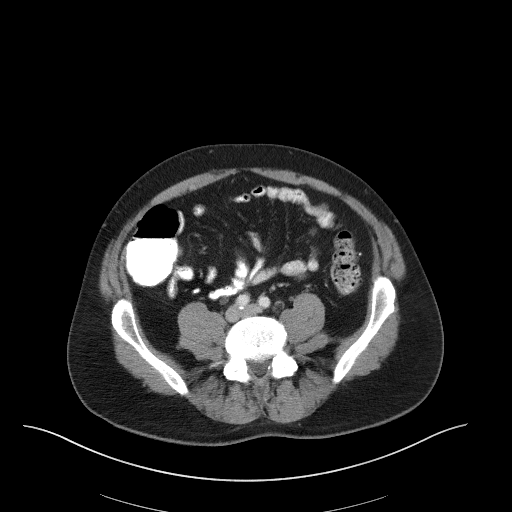
[im 49/94  soft-tissue]
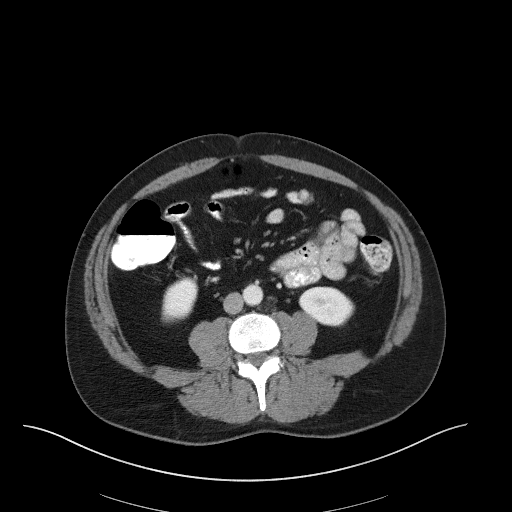
[im 53/94  soft-tissue]
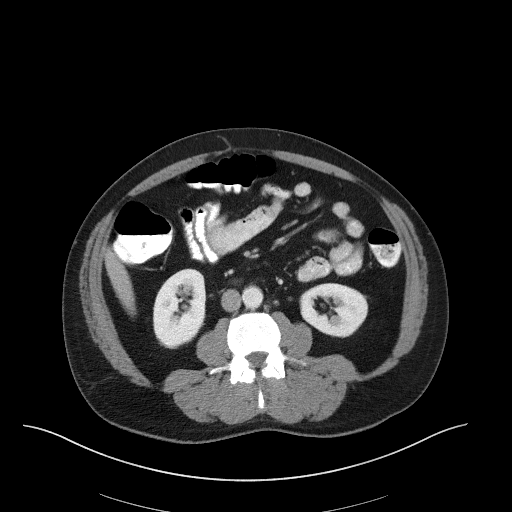
[im 61/94  soft-tissue]
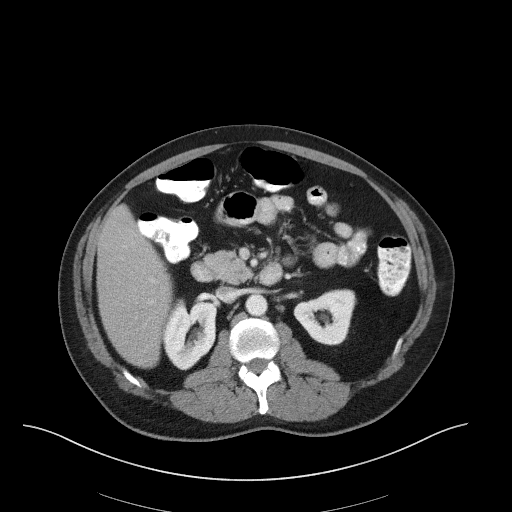
[im 61/94  bone]
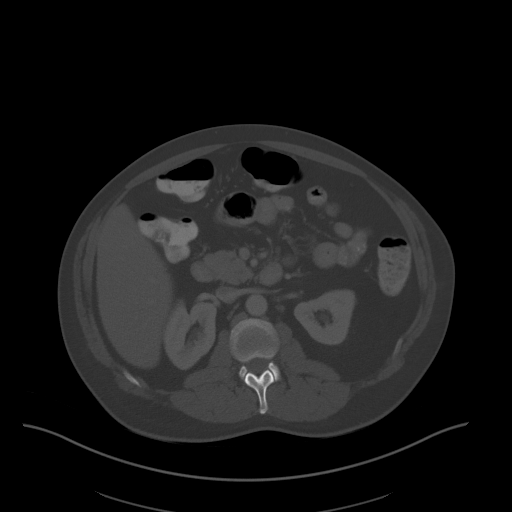
[im 69/94  soft-tissue]
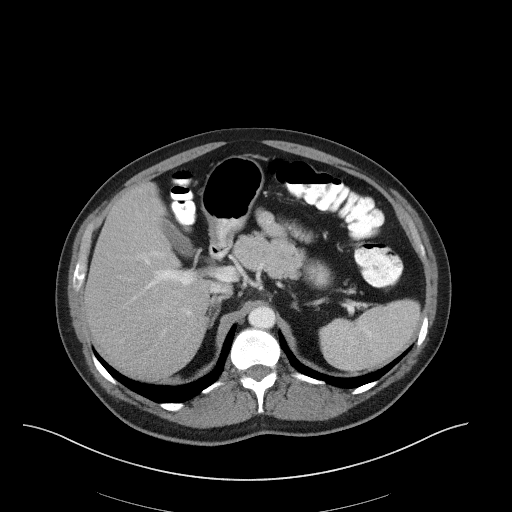
[im 73/94  soft-tissue]
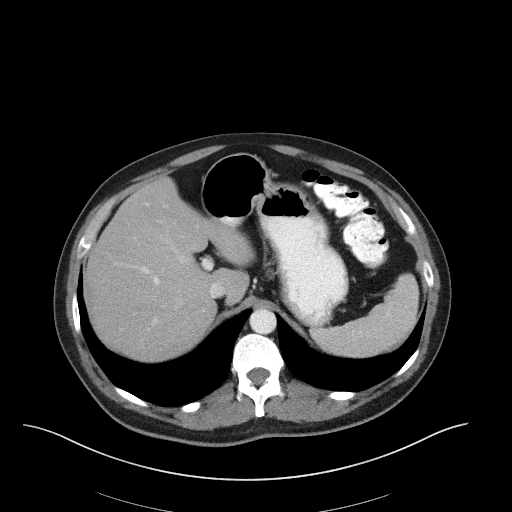
[im 81/94  soft-tissue]
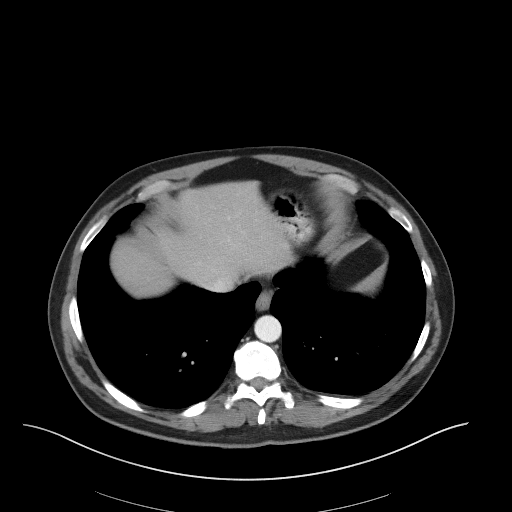
[im 89/94  soft-tissue]
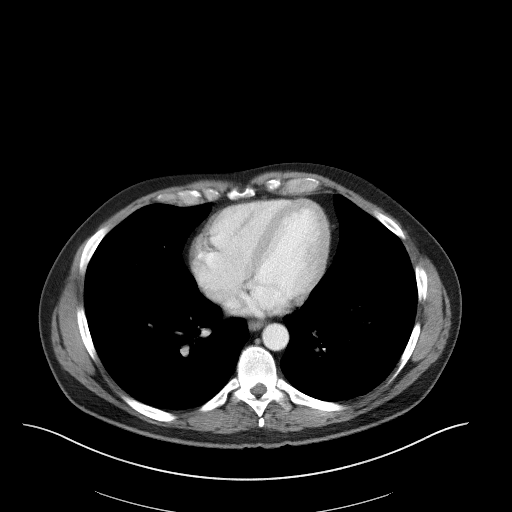

[Series 5: coronal st · coronal · 0.68mm/px · 3 of 93 slices shown]
[im 31/93  soft-tissue]
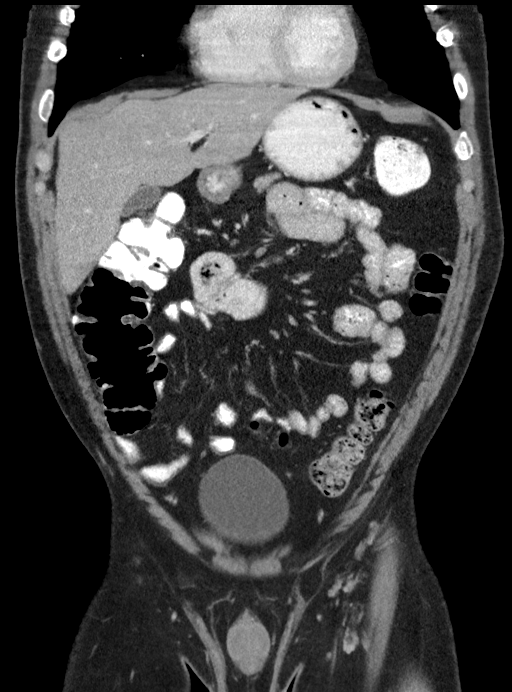
[im 41/93  soft-tissue]
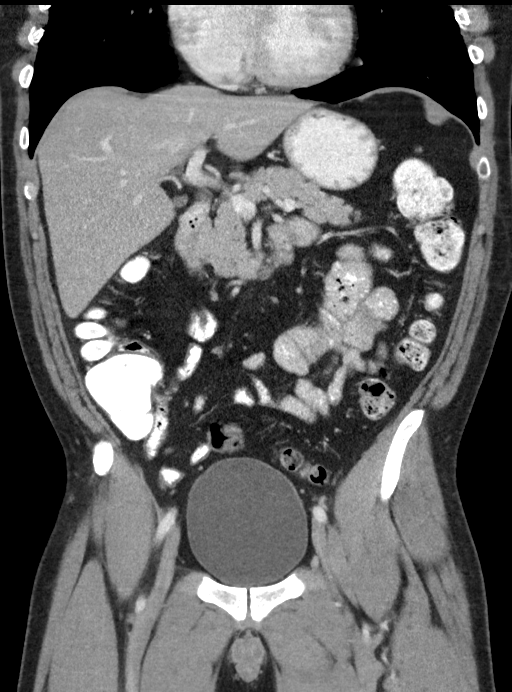
[im 52/93  soft-tissue]
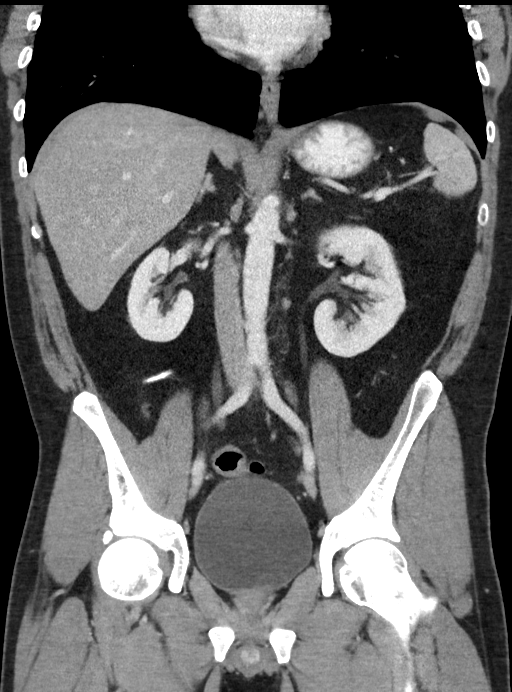

[16 of 46 positions shown; findings below may reference images not displayed]

FINDINGS: Lower chest: Lung bases demonstrate no acute consolidation or
pleural effusion. Normal heart size.

Hepatobiliary: No focal liver abnormality is seen. No gallstones,
gallbladder wall thickening, or biliary dilatation.

Pancreas: Unremarkable. No pancreatic ductal dilatation or
surrounding inflammatory changes.

Spleen: Normal in size without focal abnormality.

Adrenals/Urinary Tract: Adrenal glands are unremarkable. Kidneys are
normal, without renal calculi, focal lesion, or hydronephrosis.
Bladder is unremarkable.

Stomach/Bowel: Stomach is within normal limits. Appendix appears
normal. No evidence of bowel wall thickening, distention, or
inflammatory changes.

Vascular/Lymphatic: No significant vascular findings are present. No
enlarged abdominal or pelvic lymph nodes.

Other: No abdominal wall hernia or abnormality. No abdominopelvic
ascites.

Musculoskeletal: No acute or significant osseous findings.
IMPRESSION: Negative CT of the abdomen and pelvis.

## 2018-03-22 ENCOUNTER — Encounter: Payer: Self-pay | Admitting: Family Medicine

## 2018-03-28 ENCOUNTER — Ambulatory Visit: Payer: 59 | Admitting: Family Medicine

## 2018-03-28 ENCOUNTER — Encounter: Payer: Self-pay | Admitting: Family Medicine

## 2018-03-28 VITALS — BP 120/80 | HR 76 | Temp 97.8°F | Ht 67.0 in | Wt 179.9 lb

## 2018-03-28 DIAGNOSIS — F988 Other specified behavioral and emotional disorders with onset usually occurring in childhood and adolescence: Secondary | ICD-10-CM

## 2018-03-28 MED ORDER — AMPHETAMINE-DEXTROAMPHET ER 10 MG PO CP24: 10.0000 mg | ORAL_CAPSULE | Freq: Every day | ORAL | 0 refills | Status: DC

## 2018-03-28 NOTE — Patient Instructions (Signed)
Please come fasting to your next appointment.

## 2018-03-28 NOTE — Progress Notes (Signed)
Name: Ricardo Bell   MRN: [id number]    DOB: 1975/10/15   Date:03/28/2018       Progress Note  Subjective  Chief Complaint  Chief Complaint  Patient presents with  . 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  Started back on 5mg  dose of Adderall at last follow up in October - was working well for a week or so - had significant increase in task completion, less mistakes. However, the effect stopped after about 4-5 days, and he has been struggling again since then.  Ritalin made him too jittery and did not like the way it made him feel at the end of the day - was stopped taking this in 2015 (was on 10mg  immediate release).  Has history of depression, has been feeling good except for his sleep - he has been reading more and this helps to settle his mind. We will change to 10mg  XR today; will consider adding 5mg  IR in the afternoon PRN at future visit if still not well controlled.   Overweight: Likes to play disc golf with his sons and goes camping; eats out probably twice a week (fast food), usually cooks - wife makes crock pot meals, grills.    Patient Active Problem List   Diagnosis Date Noted  . Tobacco abuse counseling 05/24/2013  . Medication management 05/24/2013  . GERD (gastroesophageal reflux disease) 05/24/2013  . ADD (attention deficit disorder) 05/24/2013    Social History   Tobacco Use  . Smoking status: Current Every Day Smoker    Packs/day: 0.50    Years: 20.00    Pack years: 10.00    Types: Cigarettes  . Smokeless tobacco: Never Used  Substance Use Topics  . Alcohol use: Yes    Comment: 2-3 drinks a week      Current Outpatient Medications:  .  acetaminophen (TYLENOL) 325 MG tablet, Take 650 mg by mouth every 6 (six) hours as needed (for pain/headache.)., Disp: , Rfl:  .   amphetamine-dextroamphetamine (ADDERALL XR) 5 MG 24 hr capsule, Take 1 capsule (5 mg total) by mouth daily., Disp: 30 capsule, Rfl: 0 .  fluticasone (FLONASE) 50 MCG/ACT nasal spray, Place 2 sprays into both nostrils daily as needed. For stuffy nose, Disp: , Rfl: 1 .  ibuprofen (ADVIL,MOTRIN) 200 MG tablet, Take 200 mg by mouth as needed., Disp: , Rfl:  .  meloxicam (MOBIC) 15 MG tablet, Take 15 mg by mouth daily., Disp: , Rfl: 0 .  naproxen sodium (ANAPROX) 220 MG tablet, Take 440 mg by mouth 2 (two) times daily as needed (for pain.)., Disp: , Rfl:   No Known Allergies  I personally reviewed active problem list, medication list, allergies, notes from last encounter, lab results with the patient/caregiver today.  ROS  Constitutional: Negative for fever or weight change.  Respiratory: Negative for cough and shortness of breath.   Cardiovascular: Negative for chest pain or palpitations.  Gastrointestinal: Negative for abdominal pain, no bowel changes.  Musculoskeletal: Negative for gait problem or joint swelling.  Skin: Negative for rash.  Neurological: Negative for dizziness or headache.  No other specific complaints in a complete review of systems (except as listed in HPI above).   Objective  Vitals:   03/28/18 1349  BP: 120/80  Pulse: 76  Temp: 97.8 F (36.6 C)  TempSrc: Oral  SpO2: 97%  Weight: 179 lb 14.4 oz (81.6 kg)  Height: 5\' 7"  (1.702 m)   Body mass index is 28.18 kg/m.  Nursing Note and Vital Signs reviewed.  Physical Exam  Constitutional: Patient appears well-developed and well-nourished. No distress.  HENT: Head: Normocephalic and atraumatic. Eyes: Conjunctivae and EOM are normal. No scleral icterus. Neck: Normal range of motion. Neck supple. No JVD present. No thyromegaly present.  Cardiovascular: Normal rate, regular rhythm and normal heart sounds.  No murmur heard. No BLE edema. Pulmonary/Chest: Effort normal and breath sounds normal. No respiratory  distress. Musculoskeletal: Normal range of motion, no joint effusions. No gross deformities Neurological: Pt is alert and oriented to person, place, and time. No cranial nerve deficit. Coordination, balance, strength, speech and gait are normal.  Skin: Skin is warm and dry. No rash noted. No erythema.  Psychiatric: Patient has a normal mood and affect. behavior is normal. Judgment and thought content normal.  No results found for this or any previous visit (from the past 72 hour(s)).  Assessment & Plan  1. Attention deficit disorder, unspecified hyperactivity presence - amphetamine-dextroamphetamine (ADDERALL XR) 10 MG 24 hr capsule; Take 1 capsule (10 mg total) by mouth daily.  Dispense: 30 capsule; Refill: 0

## 2018-04-04 ENCOUNTER — Telehealth: Payer: Self-pay | Admitting: Family Medicine

## 2018-04-04 DIAGNOSIS — E782 Mixed hyperlipidemia: Secondary | ICD-10-CM

## 2018-04-04 NOTE — Telephone Encounter (Signed)
-----   Message from Hubbard Hartshorn, [place] sent at 12/27/2017  7:50 AM EDT ----- Regarding: Call patient to come in fasting for Lipid panel recheck Call patient to come in fasting for Lipid panel recheck

## 2018-04-04 NOTE — Telephone Encounter (Signed)
Left message for patient

## 2018-04-28 ENCOUNTER — Encounter: Payer: Self-pay | Admitting: Family Medicine

## 2018-04-28 ENCOUNTER — Ambulatory Visit: Payer: 59 | Admitting: Family Medicine

## 2018-04-28 ENCOUNTER — Other Ambulatory Visit: Payer: Self-pay

## 2018-04-28 DIAGNOSIS — F988 Other specified behavioral and emotional disorders with onset usually occurring in childhood and adolescence: Secondary | ICD-10-CM | POA: Diagnosis not present

## 2018-04-28 MED ORDER — AMPHETAMINE-DEXTROAMPHET ER 10 MG PO CP24: 10.0000 mg | ORAL_CAPSULE | Freq: Every day | ORAL | 0 refills | Status: DC

## 2018-04-28 NOTE — Progress Notes (Signed)
Name: Ricardo Bell   MRN: [id number]    DOB: 10-30-1975   Date:04/28/2018       Progress Note  Subjective  Chief Complaint  Chief Complaint  Patient presents with  . Follow-up    HPI  ADHD: Pt is back into working production full time (less office time right now) - starting at 4:30am and getting out around 2:30-3pm - medication is lasting until about 4pm. Is taking on some weekend days as well.  Started back on 5mg  XR dose of Adderall in October - was working well for a week or so - had significant increase in task completion, less mistakes. However, the effect stopped after about 4-5 days, so we increased to 10mg  XR at last visit.  Ritalin made him too jittery and did not like the way it made him feel at the end of the day - was stopped taking this in 2015 (was on 10mg  immediate release). Has history of depression, has been feeling good lately.  He is sleeping well without issues - he is exhausted by the end of the day, no mood swings at the end of the day. We will maintain today.  Patient Active Problem List   Diagnosis Date Noted  . Tobacco abuse counseling 05/24/2013  . Medication management 05/24/2013  . GERD (gastroesophageal reflux disease) 05/24/2013  . ADD (attention deficit disorder) 05/24/2013    Past Surgical History:  Procedure Laterality Date  . ESOPHAGOGASTRODUODENOSCOPY    . EXCISION MASS ABDOMINAL Left 07/30/2016   Procedure: EXCISION MASS LEFT FLANK;  Surgeon: Leonie Green, MD;  Location: [facility] ORS;  Service: General;  Laterality: Left;  . WISDOM TOOTH EXTRACTION      Family History  Problem Relation Age of Onset  . Cancer Maternal Grandmother        lung cancer  . Hyperlipidemia Maternal Grandfather   . Diabetes Maternal Grandfather   . Hyperlipidemia Father   . Hypertension Father   . Obesity Paternal Grandmother   . Hearing loss Paternal Grandfather        CAD - MI  . Heart disease Paternal Grandfather     Social History   Socioeconomic  History  . Marital status: Married    Spouse name: [name]  . Number of children: 2  . Years of education: 78  . Highest education level: Some college, no degree  Occupational History  . Occupation: Research scientist (life sciences): [employer]    Comment: [employer]  . Financial resource strain: Not hard at all  . Food insecurity:    Worry: Never true    Inability: Never true  . Transportation needs:    Medical: No    Non-medical: No  Tobacco Use  . Smoking status: Current Every Day Smoker    Packs/day: 0.50    Years: 20.00    Pack years: 10.00    Types: Cigarettes  . Smokeless tobacco: Never Used  Substance and Sexual Activity  . Alcohol use: Yes    Comment: 2-3 drinks a week   . Drug use: Yes    Types: Marijuana    Comment: occ  . Sexual activity: Yes    Partners: Female  Lifestyle  . Physical activity:    Days per week: 2 days    Minutes per session: 30 min  . Stress: Only a little  Relationships  . Social connections:    Talks on phone: Three times a week    Gets together:  Three times a week    Attends religious service: More than 4 times per year    Active member of club or organization: Yes    Attends meetings of clubs or organizations: More than 4 times per year    Relationship status: Married  . Intimate partner violence:    Fear of current or ex partner: No    Emotionally abused: No    Physically abused: No    Forced sexual activity: No  Other Topics Concern  . Not on file  Social History Narrative   [name] grew up in [place]. He lives with his wife and 2 sons in [place]. 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      Caffeine - Redbull in the morning   Exercise - walking 3 times a week during disc golf     Current Outpatient Medications:  .  acetaminophen (TYLENOL) 325 MG tablet, Take 650 mg by mouth every 6 (six) hours as needed (for pain/headache.)., Disp: , Rfl:  .   amphetamine-dextroamphetamine (ADDERALL XR) 10 MG 24 hr capsule, Take 1 capsule (10 mg total) by mouth daily., Disp: 30 capsule, Rfl: 0 .  fluticasone (FLONASE) 50 MCG/ACT nasal spray, Place 2 sprays into both nostrils daily as needed. For stuffy nose, Disp: , Rfl: 1 .  ibuprofen (ADVIL,MOTRIN) 200 MG tablet, Take 200 mg by mouth as needed., Disp: , Rfl:  .  meloxicam (MOBIC) 15 MG tablet, Take 15 mg by mouth daily., Disp: , Rfl: 0 .  naproxen sodium (ANAPROX) 220 MG tablet, Take 440 mg by mouth 2 (two) times daily as needed (for pain.)., Disp: , Rfl:   No Known Allergies  I personally reviewed active problem list, medication list, allergies, notes from last encounter, lab results with the patient/caregiver today.   ROS  Constitutional: Negative for fever or weight change.  Respiratory: Negative for cough and shortness of breath.   Cardiovascular: Negative for chest pain or palpitations.  Gastrointestinal: Negative for abdominal pain, no bowel changes.  Musculoskeletal: Negative for gait problem or joint swelling.  Skin: Negative for rash.  Neurological: Negative for dizziness or headache.  No other specific complaints in a complete review of systems (except as listed in HPI above).  Objective  Vitals:   04/28/18 0713  BP: 118/78  Pulse: 66  Resp: 16  Temp: 97.6 F (36.4 C)  TempSrc: Oral  SpO2: 99%  Weight: 172 lb 4.8 oz (78.2 kg)  Height: 5\' 7"  (1.702 m)   Body mass index is 26.99 kg/m.  Physical Exam  Constitutional: Patient appears well-developed and well-nourished. No distress.  HENT: Head: Normocephalic and atraumatic. Ears: bilateral TMs with no erythema or effusion; Nose: Nose normal. Mouth/Throat: Oropharynx is clear and moist. No oropharyngeal exudate or tonsillar swelling.  Eyes: Conjunctivae and EOM are normal. No scleral icterus.  Pupils are equal, round, and reactive to light.  Neck: Normal range of motion. Neck supple. No JVD present.  Cardiovascular:  Normal rate, regular rhythm and normal heart sounds.  No murmur heard. No BLE edema. Pulmonary/Chest: Effort normal and breath sounds normal. No respiratory distress. Musculoskeletal: Normal range of motion, no joint effusions. No gross deformities Neurological: Pt is alert and oriented to person, place, and time. No cranial nerve deficit. Coordination, balance, strength, speech and gait are normal.  Skin: Skin is warm and dry. No rash noted. No erythema.  Psychiatric: Patient has a normal mood and affect. behavior is normal. Judgment and  thought content normal.  No results found for this or any previous visit (from the past 72 hour(s)).  PHQ2/9: Depression screen [facility] 2/9 04/28/2018 03/28/2018 02/15/2018 12/24/2017 12/24/2017  Decreased Interest 0 0 0 0 0  Down, Depressed, Hopeless 0 0 0 0 0  PHQ - 2 Score 0 0 0 0 0  Altered sleeping 0 - 0 3 -  Tired, decreased energy 0 - 0 0 -  Change in appetite 0 - 0 0 -  Feeling bad or failure about yourself  0 - 0 0 -  Trouble concentrating 0 - 0 0 -  Moving slowly or fidgety/restless 0 - 0 0 -  Suicidal thoughts 0 - 0 0 -  PHQ-9 Score 0 - 0 3 -  Difficult doing work/chores Not difficult at all - Not difficult at all Not difficult at all -   Fall Risk: Fall Risk  04/28/2018 03/28/2018 02/15/2018  Falls in the past year? 0 0 0  Number falls in past yr: 0 0 0  Injury with Fall? 0 0 0  Follow up Falls evaluation completed - -   Assessment & Plan  1. Attention deficit disorder, unspecified hyperactivity presence - amphetamine-dextroamphetamine (ADDERALL XR) 10 MG 24 hr capsule; Take 1 capsule (10 mg total) by mouth daily for 30 days.  Dispense: 30 capsule; Refill: 0 - amphetamine-dextroamphetamine (ADDERALL XR) 10 MG 24 hr capsule; Take 1 capsule (10 mg total) by mouth daily for 30 days.  Dispense: 30 capsule; Refill: 0 - amphetamine-dextroamphetamine (ADDERALL XR) 10 MG 24 hr capsule; Take 1 capsule (10 mg total) by mouth daily for 30 days.   Dispense: 30 capsule; Refill: 0

## 2018-05-28 ENCOUNTER — Encounter: Payer: Self-pay | Admitting: Family Medicine

## 2018-07-04 ENCOUNTER — Encounter: Payer: Self-pay | Admitting: Family Medicine

## 2018-07-05 ENCOUNTER — Ambulatory Visit (INDEPENDENT_AMBULATORY_CARE_PROVIDER_SITE_OTHER): Payer: 59 | Admitting: Family Medicine

## 2018-07-05 ENCOUNTER — Other Ambulatory Visit: Payer: Self-pay

## 2018-07-05 ENCOUNTER — Encounter: Payer: Self-pay | Admitting: Family Medicine

## 2018-07-05 ENCOUNTER — Encounter (INDEPENDENT_AMBULATORY_CARE_PROVIDER_SITE_OTHER): Payer: Self-pay

## 2018-07-05 ENCOUNTER — Telehealth: Payer: Self-pay | Admitting: Family Medicine

## 2018-07-05 VITALS — Temp 100.2°F

## 2018-07-05 DIAGNOSIS — R52 Pain, unspecified: Secondary | ICD-10-CM

## 2018-07-05 DIAGNOSIS — R6889 Other general symptoms and signs: Secondary | ICD-10-CM

## 2018-07-05 DIAGNOSIS — R5383 Other fatigue: Secondary | ICD-10-CM | POA: Diagnosis not present

## 2018-07-05 DIAGNOSIS — R197 Diarrhea, unspecified: Secondary | ICD-10-CM

## 2018-07-05 DIAGNOSIS — R509 Fever, unspecified: Secondary | ICD-10-CM

## 2018-07-05 DIAGNOSIS — Z20822 Contact with and (suspected) exposure to covid-19: Secondary | ICD-10-CM

## 2018-07-05 NOTE — Progress Notes (Signed)
Name: Ricardo Bell   MRN: [id number]    DOB: Aug 11, 1975   Date:07/05/2018       Progress Note  Subjective  Chief Complaint  Chief Complaint  Patient presents with  . Diarrhea    fever 100.2, congested, weakness    I connected with  JANIE STROTHMAN  on 07/05/18 at 10:40 AM EDT by a video enabled telemedicine application and verified that I am speaking with the correct person using two identifiers.  I discussed the limitations of evaluation and management by telemedicine and the availability of in person appointments. The patient expressed understanding and agreed to proceed. Staff also discussed with the patient that there may be a patient responsible charge related to this service. Patient Location: Home Provider Location: Home Additional Individuals present: None  HPI  Pt presents with concern for about a week of chills, fever, diarrhea, fatigue and feeling weak.  He is still working and has therefore been unable to isolate from the public, though no one has been known COVID19+.  Today he is feeling very drained, very fatigued.  Does have nasal congestion (has allergies), no coughing, chest pain, or shortness of breath, NV, or abdominal pain; did have 2 episodes of shivering/shaking chills on Friday/Saturday evenings.  This morning his temp was 100.84F prior to taking antipyretic.  Diarrhea has subsided (1 episode in the last 24 hours). His fatigue and body aches are still quite significant. No history of diverticulitis. Eating and drinking without issues - drinking extra water - urine is normal color and odor.  He is a smoker, also has seasonal allergies and is taking zyrtec which is helping, though he notes some wheezing intermittently - declines albuterol inhaler Rx at this time.    Patient Active Problem List   Diagnosis Date Noted  . Tobacco abuse counseling 05/24/2013  . Medication management 05/24/2013  . GERD (gastroesophageal reflux disease) 05/24/2013  . ADD (attention deficit  disorder) 05/24/2013    Social History   Tobacco Use  . Smoking status: Current Every Day Smoker    Packs/day: 0.50    Years: 20.00    Pack years: 10.00    Types: Cigarettes  . Smokeless tobacco: Never Used  Substance Use Topics  . Alcohol use: Yes    Comment: 2-3 drinks a week      Current Outpatient Medications:  .  acetaminophen (TYLENOL) 325 MG tablet, Take 650 mg by mouth every 6 (six) hours as needed (for pain/headache.)., Disp: , Rfl:  .  amphetamine-dextroamphetamine (ADDERALL XR) 10 MG 24 hr capsule, Take 1 capsule (10 mg total) by mouth daily for 30 days., Disp: 30 capsule, Rfl: 0 .  fluticasone (FLONASE) 50 MCG/ACT nasal spray, Place 2 sprays into both nostrils daily as needed. For stuffy nose, Disp: , Rfl: 1 .  ibuprofen (ADVIL,MOTRIN) 200 MG tablet, Take 200 mg by mouth as needed., Disp: , Rfl:  .  meloxicam (MOBIC) 15 MG tablet, Take 15 mg by mouth daily., Disp: , Rfl: 0 .  naproxen sodium (ANAPROX) 220 MG tablet, Take 440 mg by mouth 2 (two) times daily as needed (for pain.)., Disp: , Rfl:  .  amphetamine-dextroamphetamine (ADDERALL XR) 10 MG 24 hr capsule, Take 1 capsule (10 mg total) by mouth daily for 30 days., Disp: 30 capsule, Rfl: 0 .  amphetamine-dextroamphetamine (ADDERALL XR) 10 MG 24 hr capsule, Take 1 capsule (10 mg total) by mouth daily for 30 days., Disp: 30 capsule, Rfl: 0  No Known Allergies  I  personally reviewed active problem list, medication list, allergies with the patient/caregiver today.  ROS  Ten systems reviewed and is negative except as mentioned in HPI   Objective  Virtual encounter, vitals not obtained.  There is no height or weight on file to calculate BMI.  Nursing Note and Vital Signs reviewed.  Physical Exam  Constitutional: Patient appears well-developed and well-nourished. No distress.  HENT: Head: Normocephalic and atraumatic.  Neck: Normal range of motion. Pulmonary/Chest: Effort normal. No respiratory distress.  Speaking in complete sentences Neurological: Pt is alert and oriented to person, place, and time. Coordination, speech and gait are normal.  Psychiatric: Patient has a normal mood and affect. behavior is normal. Judgment and thought content normal. Abdominal:  No results found for this or any previous visit (from the past 72 hour(s)).  Assessment & Plan  1. 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  - [employer] COVID-19 HOME MONITORING PROGRAM - Temperature monitoring; Future  2. Diarrhea, unspecified type - This symptom is improving, he has no nausea, vomiting, or abdominal pain, so we will not perform any additional testing today.  Increase fluids as much as possible, advance diet as tolerated  3. Fatigue, unspecified type - Secondary to fevers/body aches, unknown likely viral infection. Encouraged rest  4. Body aches - Encouraged acetaminophen for body aches and fevers.  5. Fever in adult - Encouraged acetaminophen for body aches and fevers.   -Red flags and when to present for emergency care or RTC including fever >100.37F, chest pain, shortness of breath, new/worsening/un-resolving symptoms, reviewed with patient at time of visit. Follow up and care instructions discussed and provided in AVS. - I discussed the assessment and treatment plan with the patient. The patient was provided an opportunity to ask questions and all were answered. The patient agreed with the plan and demonstrated an understanding of the instructions.  I provided 16 minutes of  non-face-to-face time during this encounter.  Hubbard Hartshorn, FNP

## 2018-07-05 NOTE — Telephone Encounter (Signed)
BPA for worsening weakness sent via [place].See MyChart message from 07/05/18.Pt seen for virtual visit with Tessie Eke today regarding worsening weakness. MyChart message sent to pt to continue treatment plan as advised by PCP and if symptoms become worse with the inability to stand or if he has to hold on to something in order to get balance, call 911 and seek treatment in the ED.

## 2018-07-06 ENCOUNTER — Encounter (INDEPENDENT_AMBULATORY_CARE_PROVIDER_SITE_OTHER): Payer: Self-pay

## 2018-07-07 ENCOUNTER — Telehealth: Payer: Self-pay | Admitting: Family Medicine

## 2018-07-07 ENCOUNTER — Encounter (INDEPENDENT_AMBULATORY_CARE_PROVIDER_SITE_OTHER): Payer: Self-pay

## 2018-07-07 NOTE — Telephone Encounter (Signed)
Patient's mychart questionnaire for COVID19 states that his weakness is getting worse and fever is still present.  Please call to check in with him.  I recommend he go to Urgent Care or ER if he is feeling worse and worse over the last few days.  He may need a chest Xray, labs to see if he is septic, and/or antibiotic.

## 2018-07-07 NOTE — Telephone Encounter (Signed)
Left message for patient to call office.  

## 2018-07-08 ENCOUNTER — Encounter (INDEPENDENT_AMBULATORY_CARE_PROVIDER_SITE_OTHER): Payer: Self-pay

## 2018-07-08 ENCOUNTER — Ambulatory Visit: Payer: 59 | Admitting: Family Medicine

## 2018-07-08 ENCOUNTER — Encounter: Payer: Self-pay | Admitting: Family Medicine

## 2018-07-09 ENCOUNTER — Encounter (INDEPENDENT_AMBULATORY_CARE_PROVIDER_SITE_OTHER): Payer: Self-pay

## 2018-07-10 ENCOUNTER — Encounter (INDEPENDENT_AMBULATORY_CARE_PROVIDER_SITE_OTHER): Payer: Self-pay

## 2018-07-11 ENCOUNTER — Encounter: Payer: Self-pay | Admitting: Family Medicine

## 2018-07-11 ENCOUNTER — Encounter (INDEPENDENT_AMBULATORY_CARE_PROVIDER_SITE_OTHER): Payer: Self-pay

## 2018-07-12 ENCOUNTER — Encounter (INDEPENDENT_AMBULATORY_CARE_PROVIDER_SITE_OTHER): Payer: Self-pay

## 2018-07-22 ENCOUNTER — Encounter: Payer: Self-pay | Admitting: Family Medicine

## 2018-07-22 NOTE — Telephone Encounter (Signed)
Checked controlled substance database and my Rx's that were provided at last visit.  Pt should have Rx for 30-day supply of adderall that can be filled between 07/03/2018 and 08/02/2018. Called [facility] - they will fill for him to pick up today.

## 2018-08-10 ENCOUNTER — Ambulatory Visit: Payer: 59 | Admitting: Family Medicine

## 2018-08-10 ENCOUNTER — Encounter: Payer: Self-pay | Admitting: Family Medicine

## 2018-08-10 ENCOUNTER — Other Ambulatory Visit: Payer: Self-pay

## 2018-08-10 VITALS — BP 122/88 | HR 94 | Temp 98.1°F | Resp 14 | Ht 66.0 in | Wt 155.3 lb

## 2018-08-10 DIAGNOSIS — T22111A Burn of first degree of right forearm, initial encounter: Secondary | ICD-10-CM

## 2018-08-10 DIAGNOSIS — T22232A Burn of second degree of left upper arm, initial encounter: Secondary | ICD-10-CM | POA: Diagnosis not present

## 2018-08-10 DIAGNOSIS — F988 Other specified behavioral and emotional disorders with onset usually occurring in childhood and adolescence: Secondary | ICD-10-CM | POA: Diagnosis not present

## 2018-08-10 MED ORDER — SILVER SULFADIAZINE 1 % EX CREA: 1.0000 "application " | TOPICAL_CREAM | Freq: Every day | CUTANEOUS | 0 refills | Status: DC

## 2018-08-10 NOTE — Progress Notes (Signed)
Name: Ricardo Bell   MRN: [id number]    DOB: [birth year]   Date:08/10/2018       Progress Note  Subjective  Chief Complaint  Chief Complaint  Patient presents with   Burn    left upper arm at work on 6/2    HPI  PT presents with concern  For burn to the LEFT upper posterior arm and RIGHT forearm on 08/09/2018 while pulling a large car part out of an oven at work.  He endorses pain and blistering, no drainage. No fever or chills.  He is also overdue for ADHD follow up. 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 Is taking on some weekend days as well.  At 10mg  XR of Adderall, doing well on dose - sleeping at night, getting work done during the day.Ritalin made him too jittery and did not like the way it made him feelat the end of the day- was stopped taking this in 2015 (was on 10mg  immediate release). Has history of depression, has been feeling good lately.  He is sleeping well without issues - he is exhausted by the end of the day, no mood swings at the end of the day. No palpitations or elevations in BP.We will maintain today.  Patient Active Problem List   Diagnosis Date Noted   Tobacco abuse counseling 05/24/2013   Medication management 05/24/2013   GERD (gastroesophageal reflux disease) 05/24/2013   ADD (attention deficit disorder) 05/24/2013    Social History   Tobacco Use   Smoking status: Current Every Day Smoker    Packs/day: 0.50    Years: 20.00    Pack years: 10.00    Types: Cigarettes   Smokeless tobacco: Never Used  Substance Use Topics   Alcohol use: Yes    Comment: 2-3 drinks a week      Current Outpatient Medications:    acetaminophen (TYLENOL) 325 MG tablet, Take 650 mg by mouth every 6 (six) hours as needed (for pain/headache.)., Disp: , Rfl:    fluticasone (FLONASE) 50 MCG/ACT nasal spray, Place 2 sprays into both  nostrils daily as needed. For stuffy nose, Disp: , Rfl: 1   ibuprofen (ADVIL,MOTRIN) 200 MG tablet, Take 200 mg by mouth as needed., Disp: , Rfl:    naproxen sodium (ANAPROX) 220 MG tablet, Take 440 mg by mouth 2 (two) times daily as needed (for pain.)., Disp: , Rfl:    amphetamine-dextroamphetamine (ADDERALL XR) 10 MG 24 hr capsule, Take 1 capsule (10 mg total) by mouth daily for 30 days., Disp: 30 capsule, Rfl: 0   amphetamine-dextroamphetamine (ADDERALL XR) 10 MG 24 hr capsule, Take 1 capsule (10 mg total) by mouth daily for 30 days., Disp: 30 capsule, Rfl: 0   amphetamine-dextroamphetamine (ADDERALL XR) 10 MG 24 hr capsule, Take 1 capsule (10 mg total) by mouth daily for 30 days., Disp: 30 capsule, Rfl: 0  No Known Allergies  I personally reviewed active problem list, medication list, allergies, notes from last encounter, lab results with the patient/caregiver today.  ROS  Constitutional: Negative for fever or weight change.  Respiratory: Negative for cough and shortness of breath.   Cardiovascular: Negative for chest pain or palpitations.  Gastrointestinal: Negative for abdominal pain, no bowel changes.  Musculoskeletal: Negative for gait problem or joint swelling.  Skin: Negative for rash.  Neurological: Negative for dizziness or headache.  No other  specific complaints in a complete review of systems (except as listed in HPI above).  Objective  Vitals:   08/10/18 1326  BP: 122/88  Pulse: 94  Resp: 14  Temp: 98.1 F (36.7 C)  TempSrc: Oral  SpO2: 99%  Weight: 155 lb 4.8 oz (70.4 kg)  Height: 5\' 6"  (1.676 m)   Body mass index is 25.07 kg/m.  Nursing Note and Vital Signs reviewed.  Physical Exam  Constitutional: Patient appears well-developed and well-nourished. No distress.  HENT: Head: Normocephalic and atraumatic. Eyes: Conjunctivae and EOM are normal. No scleral icterus.  Cardiovascular: Normal rate, regular rhythm and normal heart sounds.  No murmur heard.  No BLE edema. Pulmonary/Chest: Effort normal and breath sounds normal. No respiratory distress. Musculoskeletal: Normal range of motion, no joint effusions. No gross deformities Neurological: Pt is alert and oriented to person, place, and time. No cranial nerve deficit. Coordination, balance, strength, speech and gait are normal.  Skin: There is a linear streak that is approx 5cm x1cm with 3 blisters and underlying erythema present on the LEFT upper posterior arm; there is also a 3cmx1cm superficial burn that is erythematous Psychiatric: Patient has a normal mood and affect. behavior is normal. Judgment and thought content normal.  No results found for this or any previous visit (from the past 72 hour(s)).  Assessment & Plan  1. Partial thickness burn of left upper arm, initial encounter - silver sulfADIAZINE (SILVADENE) 1 % cream; Apply 1 application topically daily.  Dispense: 50 g; Refill: 0 - Monitor for signs and symptoms of infection.  2. Superficial burn of right forearm, initial encounter - silver sulfADIAZINE (SILVADENE) 1 % cream; Apply 1 application topically daily.  Dispense: 50 g; Refill: 0  3. Attention deficit disorder, unspecified hyperactivity presence - amphetamine-dextroamphetamine (ADDERALL XR) 10 MG 24 hr capsule; Take 1 capsule (10 mg total) by mouth daily for 30 days.  Dispense: 30 capsule; Refill: 0 - amphetamine-dextroamphetamine (ADDERALL XR) 10 MG 24 hr capsule; Take 1 capsule (10 mg total) by mouth daily for 30 days.  Dispense: 30 capsule; Refill: 0 - amphetamine-dextroamphetamine (ADDERALL XR) 10 MG 24 hr capsule; Take 1 capsule (10 mg total) by mouth daily for 30 days.  Dispense: 30 capsule; Refill: 0   -Red flags and when to present for emergency care or RTC including fever >101.59F, chest pain, shortness of breath, new/worsening/un-resolving symptoms, reviewed with patient at time of visit. Follow up and care instructions discussed and provided in AVS.

## 2018-08-11 MED ORDER — AMPHETAMINE-DEXTROAMPHET ER 10 MG PO CP24: 10.0000 mg | ORAL_CAPSULE | Freq: Every day | ORAL | 0 refills | Status: DC

## 2018-08-16 ENCOUNTER — Encounter: Payer: Self-pay | Admitting: Family Medicine

## 2018-11-11 ENCOUNTER — Other Ambulatory Visit: Payer: Self-pay

## 2018-11-11 ENCOUNTER — Ambulatory Visit (INDEPENDENT_AMBULATORY_CARE_PROVIDER_SITE_OTHER): Payer: 59 | Admitting: Family Medicine

## 2018-11-11 ENCOUNTER — Encounter: Payer: Self-pay | Admitting: Family Medicine

## 2018-11-11 DIAGNOSIS — F988 Other specified behavioral and emotional disorders with onset usually occurring in childhood and adolescence: Secondary | ICD-10-CM

## 2018-11-11 NOTE — Progress Notes (Signed)
Name: Ricardo Bell   MRN: [id number]    DOB: [birth year]   Date:11/11/2018       Progress Note  Subjective  Chief Complaint  Chief Complaint  Patient presents with  . Follow-up    ADD medication refills    I connected with  Ricardo Bell on 11/11/18 at  7:20 AM EDT by telephone and verified that I am speaking with the correct person using two identifiers.  I discussed the limitations, risks, security and privacy concerns of performing an evaluation and management service by telephone and the availability of in person appointments. Staff also discussed with the patient that there may be a patient responsible charge related to this service. Patient Location: Home Provider Location: Office Additional Individuals present: None  HPI  ADHD:Pt states that he has been putting a lot of hours in at work, is extremely busy and this has kept him focused.  He has not taken medication in about 1.5 weeks and is doing well like this, has not filled his 3rd fill from last visit yet and asks that we cancel it.  I advised I am happy to do so, but he is always welcome to call back if his job tasks change again as more sedentary/desk work seems to be a trigger for his inattention/difficulty accomplishing tasks.  I am happy to have him stop the medication at this time to see how things go over the next few months.  He will call back if anything changes.  Patient Active Problem List   Diagnosis Date Noted  . Tobacco abuse counseling 05/24/2013  . Medication management 05/24/2013  . GERD (gastroesophageal reflux disease) 05/24/2013  . ADD (attention deficit disorder) 05/24/2013    Past Surgical History:  Procedure Laterality Date  . ESOPHAGOGASTRODUODENOSCOPY    . EXCISION MASS ABDOMINAL Left 07/30/2016   Procedure: EXCISION MASS LEFT FLANK;  Surgeon: Leonie Green, MD;  Location: [facility] ORS;  Service: General;  Laterality: Left;  . WISDOM TOOTH EXTRACTION      Family History  Problem  Relation Age of Onset  . Cancer Maternal Grandmother        lung cancer  . Hyperlipidemia Maternal Grandfather   . Diabetes Maternal Grandfather   . Hyperlipidemia Father   . Hypertension Father   . Obesity Paternal Grandmother   . Hearing loss Paternal Grandfather        CAD - MI  . Heart disease Paternal Grandfather     Social History   Socioeconomic History  . Marital status: Married    Spouse name: Ricardo Bell  . Number of children: 2  . Years of education: 66  . Highest education level: Some college, no degree  Occupational History  . Occupation: Research scientist (life sciences): [employer]    Comment: [place]  . Financial resource strain: Not hard at all  . Food insecurity    Worry: Never true    Inability: Never true  . Transportation needs    Medical: No    Non-medical: No  Tobacco Use  . Smoking status: Current Every Day Smoker    Packs/day: 0.50    Years: 20.00    Pack years: 10.00    Types: Cigarettes  . Smokeless tobacco: Never Used  Substance and Sexual Activity  . Alcohol use: Yes    Comment: 2-3 drinks a week   . Drug use: Yes    Types: Marijuana    Comment: occ  . Sexual  activity: Yes    Partners: Female  Lifestyle  . Physical activity    Days per week: 2 days    Minutes per session: 30 min  . Stress: Only a little  Relationships  . Social Herbalist on phone: Three times a week    Gets together: Three times a week    Attends religious service: More than 4 times per year    Active member of club or organization: Yes    Attends meetings of clubs or organizations: More than 4 times per year    Relationship status: Married  . Intimate partner violence    Fear of current or ex partner: No    Emotionally abused: No    Physically abused: No    Forced sexual activity: No  Other Topics Concern  . Not on file  Social History Narrative   Ricardo Bell grew up in [place]. He lives with his wife and 2 sons in [place].  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 He plays disc golf on his spare time.      Caffeine - Redbull in the morning   Exercise - walking 3 times a week during disc golf     Current Outpatient Medications:  .  acetaminophen (TYLENOL) 325 MG tablet, Take 650 mg by mouth every 6 (six) hours as needed (for pain/headache.)., Disp: , Rfl:  .  amphetamine-dextroamphetamine (ADDERALL XR) 10 MG 24 hr capsule, Take 1 capsule (10 mg total) by mouth daily for 30 days., Disp: 30 capsule, Rfl: 0 .  fluticasone (FLONASE) 50 MCG/ACT nasal spray, Place 2 sprays into both nostrils daily as needed. For stuffy nose, Disp: , Rfl: 1 .  ibuprofen (ADVIL,MOTRIN) 200 MG tablet, Take 200 mg by mouth as needed., Disp: , Rfl:  .  amphetamine-dextroamphetamine (ADDERALL XR) 10 MG 24 hr capsule, Take 1 capsule (10 mg total) by mouth daily for 30 days., Disp: 30 capsule, Rfl: 0 .  amphetamine-dextroamphetamine (ADDERALL XR) 10 MG 24 hr capsule, Take 1 capsule (10 mg total) by mouth daily for 30 days., Disp: 30 capsule, Rfl: 0 .  naproxen sodium (ANAPROX) 220 MG tablet, Take 440 mg by mouth 2 (two) times daily as needed (for pain.)., Disp: , Rfl:  .  silver sulfADIAZINE (SILVADENE) 1 % cream, Apply 1 application topically daily. (Patient not taking: Reported on 11/11/2018), Disp: 50 g, Rfl: 0  No Known Allergies  I personally reviewed active problem list, medication list, allergies, notes from last encounter with the patient/caregiver today.   ROS  Constitutional: Negative for fever or weight change.  Respiratory: Negative for cough and shortness of breath.   Cardiovascular: Negative for chest pain or palpitations.  Gastrointestinal: Negative for abdominal pain, no bowel changes.  Musculoskeletal: Negative for gait problem or joint swelling.  Skin: Negative for rash.  Neurological: Negative for dizziness or headache.  No other specific complaints in a complete review of systems (except as listed in HPI above)  Objective   Virtual encounter, vitals not obtained.  There is no height or weight on file to calculate BMI.  Physical Exam  Pulmonary/Chest: Effort normal. No respiratory distress. Speaking in complete sentences Neurological: Pt is alert and oriented to person, place, and time. Speech is normal Psychiatric: Patient has a normal mood and affect. behavior is normal. Judgment and thought content normal.    No results found for this or any previous visit (from the past 72 hour(s)).  PHQ2/9: Depression screen [facility] 2/9 11/11/2018 08/10/2018 07/05/2018 04/28/2018  03/28/2018  Decreased Interest 0 0 0 0 0  Down, Depressed, Hopeless 0 0 0 0 0  PHQ - 2 Score 0 0 0 0 0  Altered sleeping 0 0 0 0 -  Tired, decreased energy 0 0 0 0 -  Change in appetite 0 0 0 0 -  Feeling bad or failure about yourself  0 0 0 0 -  Trouble concentrating 0 0 0 0 -  Moving slowly or fidgety/restless 0 0 0 0 -  Suicidal thoughts 0 0 0 0 -  PHQ-9 Score 0 0 0 0 -  Difficult doing work/chores Not difficult at all Not difficult at all Not difficult at all Not difficult at all -   PHQ-2/9 Result is negative.    Fall Risk: Fall Risk  11/11/2018 08/10/2018 07/05/2018 04/28/2018 03/28/2018  Falls in the past year? 0 0 0 0 0  Number falls in past yr: 0 0 0 0 0  Injury with Fall? 0 0 0 0 0  Follow up Falls evaluation completed Falls evaluation completed Falls evaluation completed Falls evaluation completed -    Assessment & Plan  1. Attention deficit disorder, unspecified hyperactivity presence - STOP Adderall per patient's request, will see how things go without medication; if job duties change may need to return to low dose. He is aware that he can always call back to discuss medications for his ADHD.  I discussed the assessment and treatment plan with the patient. The patient was provided an opportunity to ask questions and all were answered. The patient agreed with the plan and demonstrated an understanding of the instructions.   The  patient was advised to call back or seek an in-person evaluation if the symptoms worsen or if the condition fails to improve as anticipated.  I provided 8 minutes of non-face-to-face time during this encounter.  Hubbard Hartshorn, FNP

## 2018-12-01 ENCOUNTER — Encounter: Payer: Self-pay | Admitting: Family Medicine

## 2018-12-01 ENCOUNTER — Ambulatory Visit (INDEPENDENT_AMBULATORY_CARE_PROVIDER_SITE_OTHER): Payer: 59 | Admitting: Family Medicine

## 2018-12-01 ENCOUNTER — Other Ambulatory Visit: Payer: Self-pay | Admitting: *Deleted

## 2018-12-01 VITALS — Temp 97.2°F | Ht 66.0 in | Wt 157.0 lb

## 2018-12-01 DIAGNOSIS — R05 Cough: Secondary | ICD-10-CM | POA: Diagnosis not present

## 2018-12-01 DIAGNOSIS — R0981 Nasal congestion: Secondary | ICD-10-CM

## 2018-12-01 DIAGNOSIS — R059 Cough, unspecified: Secondary | ICD-10-CM

## 2018-12-01 DIAGNOSIS — Z1159 Encounter for screening for other viral diseases: Secondary | ICD-10-CM

## 2018-12-01 DIAGNOSIS — Z20822 Contact with and (suspected) exposure to covid-19: Secondary | ICD-10-CM

## 2018-12-01 NOTE — Progress Notes (Signed)
Name: Ricardo Bell   MRN: [id number]    DOB: [birth year]   Date:12/01/2018       Progress Note  Subjective  Chief Complaint  Chief Complaint  Patient presents with  . Head Congestion    Onset- Monday, has tried Tylenol head and cold  . Chest Congestion    coughing up greenish/gray phlegm and now white colored   . Ear Pressure    Bilateral ear pressure  . Sinus Problem    Sinus pressure    I connected with  CAYLOB CHAVANA  on 12/01/18 at 11:40 AM EDT by a video enabled telemedicine application and verified that I am speaking with the correct person using two identifiers.  I discussed the limitations of evaluation and management by telemedicine and the availability of in person appointments. The patient expressed understanding and agreed to proceed. Staff also discussed with the patient that there may be a patient responsible charge related to this service. Patient Location: at home Provider Location: [facility]    HPI  URI: he states he woke up Monday morning. Initially had some nasal congestion, followed by post-nasal drainage, and now chest congestion and cough. He states usually worse in am's but feels well afterwards, he went to work and was sent home. He denies fever, chills, fatigue or change in appetite. No change in bowel movements , nausea or vomiting.    Patient Active Problem List   Diagnosis Date Noted  . Tobacco abuse counseling 05/24/2013  . Medication management 05/24/2013  . GERD (gastroesophageal reflux disease) 05/24/2013  . ADD (attention deficit disorder) 05/24/2013    Past Surgical History:  Procedure Laterality Date  . ESOPHAGOGASTRODUODENOSCOPY    . EXCISION MASS ABDOMINAL Left 07/30/2016   Procedure: EXCISION MASS LEFT FLANK;  Surgeon: Leonie Green, MD;  Location: [facility] ORS;  Service: General;  Laterality: Left;  . WISDOM TOOTH EXTRACTION      Family History  Problem Relation Age of Onset  . Cancer Maternal Grandmother       lung cancer  . Hyperlipidemia Maternal Grandfather   . Diabetes Maternal Grandfather   . Hyperlipidemia Father   . Hypertension Father   . Obesity Paternal Grandmother   . Hearing loss Paternal Grandfather        CAD - MI  . Heart disease Paternal Grandfather     Social History   Socioeconomic History  . Marital status: Married    Spouse name: [name]  . Number of children: 2  . Years of education: 11  . Highest education level: Some college, no degree  Occupational History  . Occupation: Research scientist (life sciences): [employer]    Comment: [place]  . Financial resource strain: Not hard at all  . Food insecurity    Worry: Never true    Inability: Never true  . Transportation needs    Medical: No    Non-medical: No  Tobacco Use  . Smoking status: Current Every Day Smoker    Packs/day: 0.50    Years: 20.00    Pack years: 10.00    Types: Cigarettes  . Smokeless tobacco: Never Used  Substance and Sexual Activity  . Alcohol use: Yes    Comment: 2-3 drinks a week   . Drug use: Yes    Types: Marijuana    Comment: occ  . Sexual activity: Yes    Partners: Female  Lifestyle  . Physical activity    Days per  week: 2 days    Minutes per session: 30 min  . Stress: Only a little  Relationships  . Social Herbalist on phone: Three times a week    Gets together: Three times a week    Attends religious service: More than 4 times per year    Active member of club or organization: Yes    Attends meetings of clubs or organizations: More than 4 times per year    Relationship status: Married  . Intimate partner violence    Fear of current or ex partner: No    Emotionally abused: No    Physically abused: No    Forced sexual activity: No  Other Topics Concern  . Not on file  Social History Narrative   [name] grew up in [place]. He lives with his wife and 2 sons in [place]. 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 He plays disc golf on  his spare time.      Caffeine - Redbull in the morning   Exercise - walking 3 times a week during disc golf     Current Outpatient Medications:  .  acetaminophen (TYLENOL) 325 MG tablet, Take 650 mg by mouth every 6 (six) hours as needed (for pain/headache.)., Disp: , Rfl:  .  ibuprofen (ADVIL,MOTRIN) 200 MG tablet, Take 200 mg by mouth as needed., Disp: , Rfl:  .  naproxen sodium (ANAPROX) 220 MG tablet, Take 440 mg by mouth 2 (two) times daily as needed (for pain.)., Disp: , Rfl:  .  fluticasone (FLONASE) 50 MCG/ACT nasal spray, Place 2 sprays into both nostrils daily as needed. For stuffy nose, Disp: , Rfl: 1  No Known Allergies  I personally reviewed active problem list, medication list, allergies, family history, social history with the patient/caregiver today.   ROS  Ten systems reviewed and is negative except as mentioned in HPI   Objective  Virtual encounter, vitals not obtained.  Body mass index is 25.34 kg/m.  Physical Exam  Awake, alert and oriented No tenderness during percussion of frontal or maxillary sinus   PHQ2/9: Depression screen [facility] 2/9 12/01/2018 11/11/2018 08/10/2018 07/05/2018 04/28/2018  Decreased Interest 0 0 0 0 0  Down, Depressed, Hopeless 0 0 0 0 0  PHQ - 2 Score 0 0 0 0 0  Altered sleeping 0 0 0 0 0  Tired, decreased energy 0 0 0 0 0  Change in appetite 0 0 0 0 0  Feeling bad or failure about yourself  0 0 0 0 0  Trouble concentrating 0 0 0 0 0  Moving slowly or fidgety/restless 0 0 0 0 0  Suicidal thoughts 0 0 0 0 0  PHQ-9 Score 0 0 0 0 0  Difficult doing work/chores Not difficult at all Not difficult at all Not difficult at all Not difficult at all Not difficult at all   PHQ-2/9 Result is negative.    Fall Risk: Fall Risk  12/01/2018 11/11/2018 08/10/2018 07/05/2018 04/28/2018  Falls in the past year? 0 0 0 0 0  Number falls in past yr: 0 0 0 0 0  Injury with Fall? 0 0 0 0 0  Follow up - Falls evaluation completed Falls evaluation completed  Falls evaluation completed Falls evaluation completed     Assessment & Plan  1. Screening for viral disease  - Novel Coronavirus, NAA ([facility])  2. Cough  - Novel Coronavirus, NAA ([facility])  3. Nasal congestion  - Novel Coronavirus, NAA ([facility])  I  discussed the assessment and treatment plan with the patient. The patient was provided an opportunity to ask questions and all were answered. The patient agreed with the plan and demonstrated an understanding of the instructions.  The patient was advised to call back or seek an in-person evaluation if the symptoms worsen or if the condition fails to improve as anticipated.  I provided 15 minutes of non-face-to-face time during this encounter.

## 2018-12-02 LAB — NOVEL CORONAVIRUS, NAA: SARS-CoV-2, NAA: NOT DETECTED

## 2019-11-09 ENCOUNTER — Encounter: Payer: 59 | Admitting: Internal Medicine
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
## 2021-12-31 ENCOUNTER — Encounter: Payer: Self-pay | Admitting: Internal Medicine

## 2021-12-31 ENCOUNTER — Ambulatory Visit: Payer: PRIVATE HEALTH INSURANCE | Admitting: Internal Medicine

## 2021-12-31 VITALS — BP 148/90 | HR 79 | Temp 98.3°F | Resp 16 | Ht 66.0 in | Wt 174.2 lb

## 2021-12-31 DIAGNOSIS — S36039D Unspecified laceration of spleen, subsequent encounter: Secondary | ICD-10-CM

## 2021-12-31 DIAGNOSIS — R030 Elevated blood-pressure reading, without diagnosis of hypertension: Secondary | ICD-10-CM

## 2021-12-31 LAB — CBC WITH DIFFERENTIAL/PLATELET
Absolute Monocytes: 774 cells/uL (ref 200–950)
Basophils Absolute: 77 cells/uL (ref 0–200)
Basophils Relative: 0.6 %
Eosinophils Absolute: 206 cells/uL (ref 15–500)
Eosinophils Relative: 1.6 %
HCT: 47.3 % (ref 38.5–50.0)
Hemoglobin: 16.2 g/dL (ref 13.2–17.1)
Lymphs Abs: 2528 cells/uL (ref 850–3900)
MCH: 30.3 pg (ref 27.0–33.0)
MCHC: 34.2 g/dL (ref 32.0–36.0)
MCV: 88.6 fL (ref 80.0–100.0)
MPV: 10.5 fL (ref 7.5–12.5)
Monocytes Relative: 6 %
Neutro Abs: 9314 cells/uL — ABNORMAL HIGH (ref 1500–7800)
Neutrophils Relative %: 72.2 %
Platelets: 334 10*3/uL (ref 140–400)
RBC: 5.34 10*6/uL (ref 4.20–5.80)
RDW: 12.9 % (ref 11.0–15.0)
Total Lymphocyte: 19.6 %
WBC: 12.9 10*3/uL — ABNORMAL HIGH (ref 3.8–10.8)

## 2021-12-31 NOTE — Patient Instructions (Addendum)
It was great seeing you today!  Plan discussed at today's visit: -Blood work ordered today, results will be uploaded to [facility].  -Continue to monitor blood pressure, decrease salt in the diet!  Follow up in: 1 month  Take care and let us know if you have any questions or concerns prior to your next visit.  Dr. Rosana Berger  DASH Eating Plan DASH stands for Dietary Approaches to Stop Hypertension. The DASH eating plan is a healthy eating plan that has been shown to: Reduce high blood pressure (hypertension). Reduce your risk for type 2 diabetes, heart disease, and stroke. Help with weight loss. What are tips for following this plan? Reading food labels Check food labels for the amount of salt (sodium) per serving. Choose foods with less than 5 percent of the Daily Value of sodium. Generally, foods with less than 300 milligrams (mg) of sodium per serving fit into this eating plan. To find whole grains, look for the word "whole" as the first word in the ingredient list. Shopping Buy products labeled as "low-sodium" or "no salt added." Buy fresh foods. Avoid canned foods and pre-made or frozen meals. Cooking Avoid adding salt when cooking. Use salt-free seasonings or herbs instead of table salt or sea salt. Check with your health care provider or pharmacist before using salt substitutes. Do not fry foods. Cook foods using healthy methods such as baking, boiling, grilling, roasting, and broiling instead. Cook with heart-healthy oils, such as olive, canola, avocado, soybean, or sunflower oil. Meal planning  Eat a balanced diet that includes: 4 or more servings of fruits and 4 or more servings of vegetables each day. Try to fill one-half of your plate with fruits and vegetables. 6-8 servings of whole grains each day. Less than 6 oz (170 g) of lean meat, poultry, or fish each day. A 3-oz (85-g) serving of meat is about the same size as a deck of cards. One egg equals 1 oz (28 g). 2-3 servings  of low-fat dairy each day. One serving is 1 cup (237 mL). 1 serving of nuts, seeds, or beans 5 times each week. 2-3 servings of heart-healthy fats. Healthy fats called omega-3 fatty acids are found in foods such as walnuts, flaxseeds, fortified milks, and eggs. These fats are also found in cold-water fish, such as sardines, salmon, and mackerel. Limit how much you eat of: Canned or prepackaged foods. Food that is high in trans fat, such as some fried foods. Food that is high in saturated fat, such as fatty meat. Desserts and other sweets, sugary drinks, and other foods with added sugar. Full-fat dairy products. Do not salt foods before eating. Do not eat more than 4 egg yolks a week. Try to eat at least 2 vegetarian meals a week. Eat more home-cooked food and less restaurant, buffet, and fast food. Lifestyle When eating at a restaurant, ask that your food be prepared with less salt or no salt, if possible. If you drink alcohol: Limit how much you use to: 0-1 drink a day for women who are not pregnant. 0-2 drinks a day for men. Be aware of how much alcohol is in your drink. In the [place] one drink equals one 12 oz bottle of beer (355 mL), one 5 oz glass of wine (148 mL), or one 1 oz glass of hard liquor (44 mL). General information Avoid eating more than 2,300 mg of salt a day. If you have hypertension, you may need to reduce your sodium intake to 1,500 mg a  day. Work with your health care provider to maintain a healthy body weight or to lose weight. Ask what an ideal weight is for you. Get at least 30 minutes of exercise that causes your heart to beat faster (aerobic exercise) most days of the week. Activities may include walking, swimming, or biking. Work with your health care provider or dietitian to adjust your eating plan to your individual calorie needs. What foods should I eat? Fruits All fresh, dried, or frozen fruit. Canned fruit in natural juice (without added  sugar). Vegetables Fresh or frozen vegetables (raw, steamed, roasted, or grilled). Low-sodium or reduced-sodium tomato and vegetable juice. Low-sodium or reduced-sodium tomato sauce and tomato paste. Low-sodium or reduced-sodium canned vegetables. Grains Whole-grain or whole-wheat bread. Whole-grain or whole-wheat pasta. Brown rice. [place]. Bulgur. Whole-grain and low-sodium cereals. Pita bread. Low-fat, low-sodium crackers. Whole-wheat flour tortillas. Meats and other proteins Skinless chicken or [place]. Ground chicken or [place]. Pork with fat trimmed off. Fish and seafood. Egg whites. Dried beans, peas, or lentils. Unsalted nuts, nut butters, and seeds. Unsalted canned beans. Lean cuts of beef with fat trimmed off. Low-sodium, lean precooked or cured meat, such as sausages or meat loaves. Dairy Low-fat (1%) or fat-free (skim) milk. Reduced-fat, low-fat, or fat-free cheeses. Nonfat, low-sodium ricotta or cottage cheese. Low-fat or nonfat yogurt. Low-fat, low-sodium cheese. Fats and oils Soft margarine without trans fats. Vegetable oil. Reduced-fat, low-fat, or light mayonnaise and salad dressings (reduced-sodium). Canola, safflower, olive, avocado, soybean, and sunflower oils. Avocado. Seasonings and condiments Herbs. Spices. Seasoning mixes without salt. Other foods Unsalted popcorn and pretzels. Fat-free sweets. The items listed above may not be a complete list of foods and beverages you can eat. Contact a dietitian for more information. What foods should I avoid? Fruits Canned fruit in a light or heavy syrup. Fried fruit. Fruit in cream or butter sauce. Vegetables Creamed or fried vegetables. Vegetables in a cheese sauce. Regular canned vegetables (not low-sodium or reduced-sodium). Regular canned tomato sauce and paste (not low-sodium or reduced-sodium). Regular tomato and vegetable juice (not low-sodium or reduced-sodium). Angie Fava. Olives. Grains Baked goods made with fat, such  as croissants, muffins, or some breads. Dry pasta or rice meal packs. Meats and other proteins Fatty cuts of meat. Ribs. Fried meat. Berniece Salines. Bologna, salami, and other precooked or cured meats, such as sausages or meat loaves. Fat from the back of a pig (fatback). Bratwurst. Salted nuts and seeds. Canned beans with added salt. Canned or smoked fish. Whole eggs or egg yolks. Chicken or [place] with skin. Dairy Whole or 2% milk, cream, and half-and-half. Whole or full-fat cream cheese. Whole-fat or sweetened yogurt. Full-fat cheese. Nondairy creamers. Whipped toppings. Processed cheese and cheese spreads. Fats and oils Butter. Stick margarine. Lard. Shortening. Ghee. Bacon fat. Tropical oils, such as coconut, palm kernel, or palm oil. Seasonings and condiments Onion salt, garlic salt, seasoned salt, table salt, and sea salt. Worcestershire sauce. Tartar sauce. Barbecue sauce. Teriyaki sauce. Soy sauce, including reduced-sodium. Steak sauce. Canned and packaged gravies. Fish sauce. Oyster sauce. Cocktail sauce. Store-bought horseradish. Ketchup. Mustard. Meat flavorings and tenderizers. Bouillon cubes. Hot sauces. Pre-made or packaged marinades. Pre-made or packaged taco seasonings. Relishes. Regular salad dressings. Other foods Salted popcorn and pretzels. The items listed above may not be a complete list of foods and beverages you should avoid. Contact a dietitian for more information. Where to find more information National Heart, Lung, and Blood Institute: https://wilson-eaton.com/ American Heart Association: www.heart.org Academy of Nutrition and Dietetics: www.eatright.org National Kidney  Foundation: www.kidney.org Summary The DASH eating plan is a healthy eating plan that has been shown to reduce high blood pressure (hypertension). It may also reduce your risk for type 2 diabetes, heart disease, and stroke. When on the DASH eating plan, aim to eat more fresh fruits and vegetables, whole grains, lean  proteins, low-fat dairy, and heart-healthy fats. With the DASH eating plan, you should limit salt (sodium) intake to 2,300 mg a day. If you have hypertension, you may need to reduce your sodium intake to 1,500 mg a day. Work with your health care provider or dietitian to adjust your eating plan to your individual calorie needs. This information is not intended to replace advice given to you by your health care provider. Make sure you discuss any questions you have with your health care provider. Document Revised: 01/27/2019 Document Reviewed: 01/27/2019 Elsevier Patient Education  [place].

## 2022-01-01 ENCOUNTER — Other Ambulatory Visit: Payer: Self-pay | Admitting: Internal Medicine

## 2022-02-03 ENCOUNTER — Encounter: Payer: Self-pay | Admitting: Internal Medicine

## 2022-02-03 ENCOUNTER — Ambulatory Visit: Payer: PRIVATE HEALTH INSURANCE | Admitting: Internal Medicine

## 2022-02-03 VITALS — BP 142/88 | HR 67 | Temp 98.2°F | Resp 18 | Ht 66.0 in | Wt 174.9 lb

## 2022-02-03 DIAGNOSIS — S36039D Unspecified laceration of spleen, subsequent encounter: Secondary | ICD-10-CM

## 2022-02-03 DIAGNOSIS — F419 Anxiety disorder, unspecified: Secondary | ICD-10-CM

## 2022-02-03 DIAGNOSIS — F988 Other specified behavioral and emotional disorders with onset usually occurring in childhood and adolescence: Secondary | ICD-10-CM

## 2022-02-03 DIAGNOSIS — I10 Essential (primary) hypertension: Secondary | ICD-10-CM

## 2022-02-03 MED ORDER — BUPROPION HCL ER (XL) 150 MG PO TB24: 150.0000 mg | ORAL_TABLET | Freq: Every day | ORAL | 1 refills | Status: AC

## 2022-02-03 MED ORDER — LISINOPRIL 5 MG PO TABS: 5.0000 mg | ORAL_TABLET | Freq: Every day | ORAL | 3 refills | Status: AC

## 2022-02-03 NOTE — Patient Instructions (Addendum)
It was great seeing you today!  Plan discussed at today's visit: -Start Lisinopril 5 mg for blood pressure, continue to monitor at home -Start Wellbutrin 150 mg daily for anxiety and inattention   Follow up in: 1 month  Take care and let us know if you have any questions or concerns prior to your next visit.  Dr. Rosana Berger  Lisinopril Tablets What is this medication? LISINOPRIL (lyse IN oh pril) treats high blood pressure and heart failure. It may also be used to prevent further damage after a heart attack. It works by relaxing blood vessels, which decreases the amount of work the heart has to do. It belongs to a group of medications called ACE inhibitors. This medicine may be used for other purposes; ask your health care provider or pharmacist if you have questions. COMMON BRAND NAME(S): Prinivil, Zestril What should I tell my care team before I take this medication? They need to know if you have any of these conditions: Diabetes Heart or blood vessel disease History of swelling of the tongue, face, or lips with difficulty breathing, difficulty swallowing, hoarseness, or tightening of the throat (angioedema) Kidney disease Low blood pressure An unusual or allergic reaction to lisinopril, insect venom, other medications, foods, dyes, or preservatives Pregnant or trying to get pregnant Breastfeeding How should I use this medication? Take this medication by mouth. Take it as directed on the prescription label at the same time every day. You can take it with or without food. If it upsets your stomach, take it with food. Keep taking it unless your care team tells you to stop. Talk to your care team about the use of this medication in children. While it may be prescribed for children as young as 6 for selected conditions, precautions do apply. Overdosage: If you think you have taken too much of this medicine contact a poison control center or emergency room at once. NOTE: This medicine is  only for you. Do not share this medicine with others. What if I miss a dose? If you miss a dose, take it as soon as you can. If it is almost time for your next dose, take only that dose. Do not take double or extra doses. What may interact with this medication? Do not take this medication with any of the following: Hymenoptera venom Sacubitril; valsartan This medication may also interact with the following: Aliskiren Angiotensin receptor blockers, such as losartan or valsartan Certain medications for diabetes Diuretics Everolimus Gold compounds Lithium NSAIDs, medications for pain and inflammation, such as ibuprofen or naproxen Potassium salts or supplements Salt substitutes Sirolimus Temsirolimus This list may not describe all possible interactions. Give your health care provider a list of all the medicines, herbs, non-prescription drugs, or dietary supplements you use. Also tell them if you smoke, drink alcohol, or use illegal drugs. Some items may interact with your medicine. What should I watch for while using this medication? Visit your care team for regular checks on your progress. Check your blood pressure as directed. Know what your blood pressure should be and when to contact your care team. Do not treat yourself for coughs, colds, or pain while you are using this medication without asking your care team for advice. Some medications may increase your blood pressure. Talk to your care team if you may be pregnant. Serious birth defects can occur if you take this medication during pregnancy. This medication may affect your coordination, reaction time, or judgment. Do not drive or operate machinery until you know  how this medication affects you. Sit up or stand slowly to reduce the risk of dizzy or fainting spells. Drinking alcohol with this medication can increase the risk of these side effects. Avoid salt substitutes unless you are told otherwise by your care team. What side effects  may I notice from receiving this medication? Side effects that you should report to your care team as soon as possible: Allergic reactions or angioedema--skin rash, itching, hives, swelling of the face, eyes, lips, tongue, arms, or legs, trouble swallowing or breathing High potassium level--muscle weakness, fast or irregular heartbeat Kidney injury--decrease in the amount of urine, swelling of the ankles, hands, or feet Liver injury--right upper belly pain, loss of appetite, nausea, light-colored stool, dark yellow or brown urine, yellowing skin or eyes, unusual weakness, fatigue Low blood pressure--dizziness, feeling faint or lightheaded, blurry vision Side effects that usually do not require medical attention (report to your care team if they continue or are bothersome): Cough Dizziness Headache This list may not describe all possible side effects. Call your doctor for medical advice about side effects. You may report side effects to FDA at 1-800-FDA-1088. Where should I keep my medication? Keep out of the reach of children and pets. Store at room temperature between 20 and 25 degrees C (68 and 77 degrees F). Protect from moisture. Keep the container tightly closed. Do not freeze. Avoid exposure to extreme heat. Get rid of any unused medication after the expiration date. To get rid of medications that are no longer needed or have expired: Take the medication to a medication take-back program. Check with your pharmacy or law enforcement to find a location. If you cannot return the medication, check the label or package insert to see if the medication should be thrown out in the garbage or flushed down the toilet. If you are not sure, ask your care team. If it is safe to put in the trash, empty the medication out of the container. Mix the medication with cat litter, dirt, coffee grounds, or other unwanted substance. Seal the mixture in a bag or container. Put it in the trash. NOTE: This sheet is a  summary. It may not cover all possible information. If you have questions about this medicine, talk to your doctor, pharmacist, or health care provider.  2023 Elsevier/Gold Standard (2007-04-16 00:00:00)  Bupropion Extended-Release Tablets (Depression/Mood Disorders) What is this medication? BUPROPION (byoo PROE pee on) treats depression. It increases norepinephrine and dopamine in the brain, hormones that help regulate mood. It belongs to a group of medications called NDRIs. This medicine may be used for other purposes; ask your health care provider or pharmacist if you have questions. COMMON BRAND NAME(S): Aplenzin, Budeprion XL, Forfivo XL, Wellbutrin XL What should I tell my care team before I take this medication? They need to know if you have any of these conditions: An eating disorder, such as anorexia or bulimia Bipolar disorder or psychosis Diabetes or high blood sugar, treated with medication Glaucoma Head injury or brain tumor Heart disease, previous heart attack, or irregular heart beat High blood pressure Kidney or liver disease Seizures (convulsions) Suicidal thoughts or a previous suicide attempt Tourette's syndrome Weight loss An unusual or allergic reaction to bupropion, other medications, foods, dyes, or preservatives Pregnant or trying to become pregnant Breast-feeding How should I use this medication? Take this medication by mouth with a glass of water. Follow the directions on the prescription label. You can take it with or without food. If it upsets your stomach,  take it with food. Do not crush, chew, or cut these tablets. This medication is taken once daily at the same time each day. Do not take your medication more often than directed. Do not stop taking this medication suddenly except upon the advice of your care team. Stopping this medication too quickly may cause serious side effects or your condition may worsen. A special MedGuide will be given to you by the  pharmacist with each prescription and refill. Be sure to read this information carefully each time. Talk to your care team about the use of this medication in children. Special care may be needed. Overdosage: If you think you have taken too much of this medicine contact a poison control center or emergency room at once. NOTE: This medicine is only for you. Do not share this medicine with others. What if I miss a dose? If you miss a dose, skip the missed dose and take your next tablet at the regular time. Do not take double or extra doses. What may interact with this medication? Do not take this medication with any of the following: Linezolid MAOIs like Azilect, Carbex, Eldepryl, Marplan, Nardil, and Parnate Methylene blue (injected into a vein) Other medications that contain bupropion like Zyban This medication may also interact with the following: Alcohol Certain medications for anxiety or sleep Certain medications for blood pressure like metoprolol, propranolol Certain medications for depression or psychotic disturbances Certain medications for HIV or AIDS like efavirenz, lopinavir, nelfinavir, ritonavir Certain medications for irregular heart beat like propafenone, flecainide Certain medications for Parkinson's disease like amantadine, levodopa Certain medications for seizures like carbamazepine, phenytoin, phenobarbital Cimetidine Clopidogrel Cyclophosphamide Digoxin Furazolidone Isoniazid Nicotine Orphenadrine Procarbazine Steroid medications like prednisone or cortisone Stimulant medications for attention disorders, weight loss, or to stay awake Tamoxifen Theophylline Thiotepa Ticlopidine Tramadol Warfarin This list may not describe all possible interactions. Give your health care provider a list of all the medicines, herbs, non-prescription drugs, or dietary supplements you use. Also tell them if you smoke, drink alcohol, or use illegal drugs. Some items may interact with  your medicine. What should I watch for while using this medication? Tell your care team if your symptoms do not get better or if they get worse. Visit your care team for regular checks on your progress. Because it may take several weeks to see the full effects of this medication, it is important to continue your treatment as prescribed. Watch for new or worsening thoughts of suicide or depression. This includes sudden changes in mood, behavior, or thoughts. These changes can happen at any time but are more common in the beginning of treatment or after a change in dose. Call your care team right away if you experience these thoughts or worsening depression. Manic episodes may happen in patients with bipolar disorder who take this medication. Watch for changes in feelings or behaviors such as feeling anxious, nervous, agitated, panicky, irritable, hostile, aggressive, impulsive, severely restless, overly excited and hyperactive, or trouble sleeping. These symptoms can happen at anytime but are more common in the beginning of treatment or after a change in dose. Call your care team right away if you notice any of these symptoms. This medication may cause serious skin reactions. They can happen weeks to months after starting the medication. Contact your care team right away if you notice fevers or flu-like symptoms with a rash. The rash may be red or purple and then turn into blisters or peeling of the skin. Or, you might notice a  red rash with swelling of the face, lips or lymph nodes in your neck or under your arms. Avoid drinks that contain alcohol while taking this medication. Drinking large amounts of alcohol, using sleeping or anxiety medications, or quickly stopping the use of these agents while taking this medication may increase your risk for a seizure. Do not drive or use heavy machinery until you know how this medication affects you. This medication can impair your ability to perform these tasks. Do  not take this medication close to bedtime. It may prevent you from sleeping. Your mouth may get dry. Chewing sugarless gum or sucking hard candy, and drinking plenty of water may help. Contact your care team if the problem does not go away or is severe. The tablet shell for some brands of this medication does not dissolve. This is normal. The tablet shell may appear whole in the stool. This is not a cause for concern. What side effects may I notice from receiving this medication? Side effects that you should report to your care team as soon as possible: Allergic reactions--skin rash, itching, hives, swelling of the face, lips, tongue, or throat Increase in blood pressure Mood and behavior changes--anxiety, nervousness, confusion, hallucinations, irritability, hostility, thoughts of suicide or self-harm, worsening mood, feelings of depression Redness, blistering, peeling, or loosening of the skin, including inside the mouth Seizures Sudden eye pain or change in vision such as blurry vision, seeing halos around lights, vision loss Side effects that usually do not require medical attention (report to your care team if they continue or are bothersome): Constipation Dizziness Dry mouth Loss of appetite Nausea Tremors or shaking Trouble sleeping This list may not describe all possible side effects. Call your doctor for medical advice about side effects. You may report side effects to FDA at 1-800-FDA-1088. Where should I keep my medication? Keep out of the reach of children and pets. Store at room temperature between 15 and 30 degrees C (59 and 86 degrees F). Throw away any unused medication after the expiration date. NOTE: This sheet is a summary. It may not cover all possible information. If you have questions about this medicine, talk to your doctor, pharmacist, or health care provider.  2023 Elsevier/Gold Standard (2020-02-26 00:00:00)
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
## 2022-03-06 ENCOUNTER — Ambulatory Visit: Payer: PRIVATE HEALTH INSURANCE | Admitting: Internal Medicine
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
## 2022-03-12 ENCOUNTER — Ambulatory Visit: Payer: PRIVATE HEALTH INSURANCE | Admitting: Internal Medicine
